# Patient Record
Sex: Female | Born: 1950 | Race: White | Hispanic: No | Smoking: Former smoker
Health system: Southern US, Community
[De-identification: ages and names within clinical notes are randomized; demographics above are authoritative.]

## PROBLEM LIST (undated history)

## (undated) DIAGNOSIS — M797 Fibromyalgia: Secondary | ICD-10-CM

## (undated) DIAGNOSIS — M549 Dorsalgia, unspecified: Secondary | ICD-10-CM

## (undated) DIAGNOSIS — K5792 Diverticulitis of intestine, part unspecified, without perforation or abscess without bleeding: Secondary | ICD-10-CM

## (undated) DIAGNOSIS — R112 Nausea with vomiting, unspecified: Secondary | ICD-10-CM

## (undated) DIAGNOSIS — K589 Irritable bowel syndrome without diarrhea: Secondary | ICD-10-CM

## (undated) DIAGNOSIS — Z973 Presence of spectacles and contact lenses: Secondary | ICD-10-CM

## (undated) DIAGNOSIS — E119 Type 2 diabetes mellitus without complications: Secondary | ICD-10-CM

## (undated) DIAGNOSIS — I10 Essential (primary) hypertension: Secondary | ICD-10-CM

## (undated) DIAGNOSIS — F410 Panic disorder [episodic paroxysmal anxiety] without agoraphobia: Secondary | ICD-10-CM

## (undated) DIAGNOSIS — I4891 Unspecified atrial fibrillation: Secondary | ICD-10-CM

## (undated) DIAGNOSIS — Z8719 Personal history of other diseases of the digestive system: Secondary | ICD-10-CM

## (undated) DIAGNOSIS — D649 Anemia, unspecified: Secondary | ICD-10-CM

## (undated) DIAGNOSIS — Z9889 Other specified postprocedural states: Secondary | ICD-10-CM

## (undated) DIAGNOSIS — F32A Depression, unspecified: Secondary | ICD-10-CM

## (undated) DIAGNOSIS — Z9289 Personal history of other medical treatment: Secondary | ICD-10-CM

## (undated) DIAGNOSIS — F329 Major depressive disorder, single episode, unspecified: Secondary | ICD-10-CM

## (undated) DIAGNOSIS — J189 Pneumonia, unspecified organism: Secondary | ICD-10-CM

## (undated) DIAGNOSIS — Z974 Presence of external hearing-aid: Secondary | ICD-10-CM

## (undated) DIAGNOSIS — M1990 Unspecified osteoarthritis, unspecified site: Secondary | ICD-10-CM

## (undated) DIAGNOSIS — F419 Anxiety disorder, unspecified: Secondary | ICD-10-CM

## (undated) DIAGNOSIS — E7800 Pure hypercholesterolemia, unspecified: Secondary | ICD-10-CM

## (undated) DIAGNOSIS — K219 Gastro-esophageal reflux disease without esophagitis: Secondary | ICD-10-CM

## (undated) DIAGNOSIS — G8929 Other chronic pain: Secondary | ICD-10-CM

## (undated) DIAGNOSIS — Z972 Presence of dental prosthetic device (complete) (partial): Secondary | ICD-10-CM

## (undated) HISTORY — PX: SMALL INTESTINE SURGERY: SHX150

## (undated) HISTORY — PX: JOINT REPLACEMENT: SHX530

## (undated) HISTORY — PX: CHOLECYSTECTOMY: SHX55

## (undated) HISTORY — PX: TUBAL LIGATION: SHX77

## (undated) HISTORY — PX: DILATION AND CURETTAGE OF UTERUS: SHX78

## (undated) HISTORY — PX: COLONOSCOPY: SHX174

## (undated) HISTORY — PX: BACK SURGERY: SHX140

## (undated) HISTORY — PX: HERNIA REPAIR: SHX51

---

## 2002-04-11 HISTORY — PX: CARPAL TUNNEL RELEASE: SHX101

## 2013-12-30 ENCOUNTER — Other Ambulatory Visit: Payer: Self-pay | Admitting: Neurosurgery

## 2013-12-30 ENCOUNTER — Ambulatory Visit
Admission: RE | Admit: 2013-12-30 | Discharge: 2013-12-30 | Disposition: A | Discharge status: Home / Self Care | Payer: Medicare Other | Source: Ambulatory Visit | Attending: Neurosurgery | Admitting: Neurosurgery

## 2013-12-30 DIAGNOSIS — M419 Scoliosis, unspecified: Secondary | ICD-10-CM

## 2014-01-01 ENCOUNTER — Other Ambulatory Visit: Payer: Self-pay | Admitting: Neurosurgery

## 2014-01-01 ENCOUNTER — Other Ambulatory Visit (HOSPITAL_COMMUNITY): Payer: Self-pay | Admitting: Neurosurgery

## 2014-01-01 DIAGNOSIS — M5416 Radiculopathy, lumbar region: Secondary | ICD-10-CM

## 2014-01-10 ENCOUNTER — Encounter (HOSPITAL_COMMUNITY): Payer: Self-pay | Admitting: Pharmacy Technician

## 2014-01-14 ENCOUNTER — Ambulatory Visit (HOSPITAL_COMMUNITY)
Admission: RE | Admit: 2014-01-14 | Discharge: 2014-01-14 | Disposition: A | Discharge status: Home / Self Care | Payer: Medicare Other | Source: Ambulatory Visit | Attending: Neurosurgery | Admitting: Neurosurgery

## 2014-01-14 DIAGNOSIS — Z79899 Other long term (current) drug therapy: Secondary | ICD-10-CM | POA: Insufficient documentation

## 2014-01-14 DIAGNOSIS — M5416 Radiculopathy, lumbar region: Secondary | ICD-10-CM | POA: Diagnosis not present

## 2014-01-14 LAB — GLUCOSE, CAPILLARY: GLUCOSE-CAPILLARY: 124 mg/dL — AB (ref 70–99)

## 2014-01-14 MED ORDER — OXYCODONE HCL 5 MG PO TABS
5.0000 mg | ORAL_TABLET | Freq: Once | ORAL | Status: AC
  Administered 2014-01-14: 5 mg via ORAL
  Filled 2014-01-14: qty 1

## 2014-01-14 MED ORDER — IOHEXOL 300 MG/ML  SOLN
10.0000 mL | Freq: Once | INTRAMUSCULAR | Status: AC | PRN
  Administered 2014-01-14: 10 mL via INTRAVENOUS

## 2014-01-14 MED ORDER — ONDANSETRON HCL 4 MG/2ML IJ SOLN: 4.0000 mg | Freq: Four times a day (QID) | INTRAMUSCULAR | Status: DC | PRN

## 2014-01-14 MED ORDER — DIAZEPAM 5 MG PO TABS
ORAL_TABLET | ORAL | Status: AC
  Administered 2014-01-14: 10 mg via ORAL
  Filled 2014-01-14: qty 2

## 2014-01-14 MED ORDER — OXYCODONE HCL 5 MG PO TABS
ORAL_TABLET | ORAL | Status: AC
  Filled 2014-01-14: qty 1

## 2014-01-14 MED ORDER — DIAZEPAM 5 MG PO TABS
10.0000 mg | ORAL_TABLET | Freq: Once | ORAL | Status: AC
  Administered 2014-01-14: 10 mg via ORAL
  Filled 2014-01-14: qty 2

## 2014-01-14 NOTE — Progress Notes (Signed)
DR STERN NOTIFIED OF 8/10 BACK PAIN AND STATES THIS IS THE LEVEL OF PAIN SHE HAS AT HOME; DR STERN NOTIFIED OF C/O PAIN AND ORDER NOTED AND PER DR STERN OK TO D/C HOME AFTER PAIN MED

## 2014-01-14 NOTE — Discharge Instructions (Signed)
Myelogram and Lumbar Puncture Discharge Instructions ° °1. Go home and rest quietly for the next 24 hours.  It is important to lie flat for the next 24 hours.  Get up only to go to the restroom.  You may lie in the bed or on a couch on your back, your stomach, your left side or your right side.  You may have one pillow under your head.  You may have pillows between your knees while you are on your side or under your knees while you are on your back. ° °2. DO NOT drive today.  Recline the seat as far back as it will go, while still wearing your seat belt, on the way home. ° °3. You may get up to go to the bathroom as needed.  You may sit up for 10 minutes to eat.  You may resume your normal diet and medications unless otherwise indicated. ° °4. The incidence of headache, nausea, or vomiting is about 5% (one in 20 patients).  If you develop a headache, lie flat and drink plenty of fluids until the headache goes away.  Caffeinated beverages may be helpful.  If you develop severe nausea and vomiting or a headache that does not go away with flat bed rest, call  [phone] ° °5. You may resume normal activities after your 24 hours of bed rest is over; however, do not exert yourself strongly or do any heavy lifting tomorrow. ° °6. Call your physician for a follow-up appointment.  The results of your myelogram will be sent directly to your physician by the following day. ° °7. If you have any questions or if complications develop after you arrive home, please call [phone] ° °Discharge instructions have been explained to the patient.  The patient, or the person responsible for the patient, fully understands these instructions. ° ° °

## 2014-01-14 NOTE — Procedures (Signed)
Lumbar puncture L 12, Omnipaque 300 6 cc

## 2014-01-27 ENCOUNTER — Other Ambulatory Visit: Payer: Self-pay | Admitting: Neurosurgery

## 2014-01-29 ENCOUNTER — Telehealth: Payer: Self-pay | Admitting: Vascular Surgery

## 2014-01-29 NOTE — Telephone Encounter (Addendum)
Message copied by Rosalyn ChartersOUX, BONNIE A on Wed Jan 29, 2014 11:22 AM ------      Message from: Lamar BlinksPULLINS, CAROL S      Created: Wed Jan 29, 2014  9:58 AM      Regarding: needs consult with Dr. Arbie CookeyEarly       Please schedule this pt. for an office consult prior to a Redo ALIF scheduled on 02/28/14.  Please remind pt. to bring the CD ROM of LS spine films to appt. with her.  ------  notified patient of appointment with dr. early on 02-25-14 at 10am, mailed np information

## 2014-02-11 ENCOUNTER — Other Ambulatory Visit: Payer: Self-pay

## 2014-02-19 ENCOUNTER — Encounter (HOSPITAL_COMMUNITY)
Admission: RE | Admit: 2014-02-19 | Discharge: 2014-02-19 | Disposition: A | Discharge status: Home / Self Care | Payer: Medicare Other | Source: Ambulatory Visit | Attending: Neurosurgery | Admitting: Neurosurgery

## 2014-02-19 ENCOUNTER — Encounter (HOSPITAL_COMMUNITY): Payer: Self-pay

## 2014-02-19 DIAGNOSIS — Z01810 Encounter for preprocedural cardiovascular examination: Secondary | ICD-10-CM | POA: Insufficient documentation

## 2014-02-19 DIAGNOSIS — Z01812 Encounter for preprocedural laboratory examination: Secondary | ICD-10-CM | POA: Insufficient documentation

## 2014-02-19 DIAGNOSIS — M438X6 Other specified deforming dorsopathies, lumbar region: Secondary | ICD-10-CM | POA: Insufficient documentation

## 2014-02-19 HISTORY — DX: Type 2 diabetes mellitus without complications: E11.9

## 2014-02-19 HISTORY — DX: Pneumonia, unspecified organism: J18.9

## 2014-02-19 HISTORY — DX: Anxiety disorder, unspecified: F41.9

## 2014-02-19 HISTORY — DX: Pure hypercholesterolemia, unspecified: E78.00

## 2014-02-19 HISTORY — DX: Dorsalgia, unspecified: M54.9

## 2014-02-19 HISTORY — DX: Anemia, unspecified: D64.9

## 2014-02-19 HISTORY — DX: Other specified postprocedural states: Z98.890

## 2014-02-19 HISTORY — DX: Personal history of other diseases of the digestive system: Z87.19

## 2014-02-19 HISTORY — DX: Depression, unspecified: F32.A

## 2014-02-19 HISTORY — DX: Irritable bowel syndrome, unspecified: K58.9

## 2014-02-19 HISTORY — DX: Presence of spectacles and contact lenses: Z97.3

## 2014-02-19 HISTORY — DX: Unspecified osteoarthritis, unspecified site: M19.90

## 2014-02-19 HISTORY — DX: Nausea with vomiting, unspecified: R11.2

## 2014-02-19 HISTORY — DX: Major depressive disorder, single episode, unspecified: F32.9

## 2014-02-19 HISTORY — DX: Essential (primary) hypertension: I10

## 2014-02-19 HISTORY — DX: Presence of external hearing-aid: Z97.4

## 2014-02-19 HISTORY — DX: Diverticulitis of intestine, part unspecified, without perforation or abscess without bleeding: K57.92

## 2014-02-19 HISTORY — DX: Other chronic pain: G89.29

## 2014-02-19 HISTORY — DX: Gastro-esophageal reflux disease without esophagitis: K21.9

## 2014-02-19 HISTORY — DX: Personal history of other medical treatment: Z92.89

## 2014-02-19 HISTORY — DX: Panic disorder (episodic paroxysmal anxiety): F41.0

## 2014-02-19 HISTORY — DX: Unspecified atrial fibrillation: I48.91

## 2014-02-19 HISTORY — DX: Presence of dental prosthetic device (complete) (partial): Z97.2

## 2014-02-19 HISTORY — DX: Fibromyalgia: M79.7

## 2014-02-19 LAB — ABO/RH: ABO/RH(D): O POS

## 2014-02-19 LAB — CBC
HEMATOCRIT: 36.9 % (ref 36.0–46.0)
Hemoglobin: 11.8 g/dL — ABNORMAL LOW (ref 12.0–15.0)
MCH: 31 pg (ref 26.0–34.0)
MCHC: 32 g/dL (ref 30.0–36.0)
MCV: 96.9 fL (ref 78.0–100.0)
Platelets: 205 10*3/uL (ref 150–400)
RBC: 3.81 MIL/uL — ABNORMAL LOW (ref 3.87–5.11)
RDW: 14 % (ref 11.5–15.5)
WBC: 8.4 10*3/uL (ref 4.0–10.5)

## 2014-02-19 LAB — BASIC METABOLIC PANEL
Anion gap: 13 (ref 5–15)
BUN: 13 mg/dL (ref 6–23)
CO2: 25 meq/L (ref 19–32)
CREATININE: 0.66 mg/dL (ref 0.50–1.10)
Calcium: 9.3 mg/dL (ref 8.4–10.5)
Chloride: 101 mEq/L (ref 96–112)
GFR calc Af Amer: >90 mL/min (ref >90)
GFR calc non Af Amer: >90 mL/min (ref >90)
Glucose, Bld: 96 mg/dL (ref 70–99)
Potassium: 4 mEq/L (ref 3.7–5.3)
Sodium: 139 mEq/L (ref 137–147)

## 2014-02-19 LAB — SURGICAL PCR SCREEN
MRSA, PCR: POSITIVE — AB
Staphylococcus aureus: POSITIVE — AB

## 2014-02-19 LAB — TYPE AND SCREEN
ABO/RH(D): O POS
ANTIBODY SCREEN: NEGATIVE

## 2014-02-19 NOTE — Progress Notes (Signed)
   02/19/14 1528  OBSTRUCTIVE SLEEP APNEA  Have you ever been diagnosed with sleep apnea through a sleep study? No  Do you snore loudly (loud enough to be heard through closed doors)?  1  Do you often feel tired, fatigued, or sleepy during the daytime? 1  Has anyone observed you stop breathing during your sleep? 0  Do you have, or are you being treated for high blood pressure? 1  BMI more than 35 kg/m2? 0  Age over 64 years old? 1  Neck circumference greater than 40 cm/16 inches? 0  Gender: 0  Obstructive Sleep Apnea Score 4

## 2014-02-19 NOTE — Progress Notes (Signed)
Pt denies SOB, chest pain, and being under the care of a cardiologist. Pt denies having a stress test, echo, and cardiac cath. Pt denies having an EKG within the last year. PCP Dr[facility] Koleen DistanceSteven James [facility]ivey [facility] [place] made aware of + STOP- BANG Assessment Tool results.

## 2014-02-19 NOTE — Pre-Procedure Instructions (Signed)
Kristen ClarkRita S Snow  02/19/2014   Your procedure is scheduled on: Friday, February 28, 2014  [facility] at 5;30 AM.  Call this number if you have problems the morning of surgery: [phone]   Remember:   Do not eat food or drink liquids after midnight Thursday, February 27, 2014   Take these medicines the morning of surgery with A SIP OF WATER:amLODipine (NORVASC),  ARIPiprazole (ABILIFY), esomeprazole (NEXIUM), memantine (NAMENDA), metoprolol tartrate (LOPRESSOR), quiNINE (QUALAQUIN), tiotropium (SPIRIVA), budesonide-formoterol (SYMBICORT),  fluticasone (FLONASE)  nasal spray, if needed: oxyCODONE-acetaminophen (PERCOCET) for pain, LORazepam (ATIVAN) for anxiety, GENTEAL eye drops for redness/ swelling  Stop taking Aspirin, vitamins, and herbal medications. Do not take any NSAIDs ie: Ibuprofen, Advil, Naproxen or any medication containing Aspirin; stop 5 days prior to procedure ( Sunday, February 23, 2014)  Do not wear jewelry, make-up or nail polish.  Do not wear lotions, powders, or perfumes. You may not wear deodorant.  Do not shave 48 hours prior to surgery.   Do not bring valuables to the hospital.  [facility] is not responsible for any belongings or valuables.               Contacts, dentures or bridgework may not be worn into surgery.  Leave suitcase in the car. After surgery it may be brought to your room.  For patients admitted to the hospital, discharge time is determined by your treatment team.               Patients discharged the day of surgery will not be allowed to drive home.  Name and phone number of your driver:   Special Instructions:  Special Instructions:Special Instructions: [facility] - Pre[facility]  Before surgery, you can play an important role.  Because skin is not sterile, your skin needs to be as free of germs as possible.  You can reduce the number of germs on you skin by washing with CHG (chlorahexidine gluconate) soap  before surgery.  CHG is an antiseptic cleaner which kills germs and bonds with the skin to continue killing germs even after washing.  Please DO NOT use if you have an allergy to CHG or antibacterial soaps.  If your skin becomes reddened/irritated stop using the CHG and inform your nurse when you arrive at Short Stay.  Do not shave (including legs and underarms) for at least 48 hours prior to the first CHG shower.  You may shave your face.  Please follow these instructions carefully:   1.  Shower with CHG Soap the night before surgery and the morning of Surgery.  2.  If you choose to wash your hair, wash your hair first as usual with your normal shampoo.  3.  After you shampoo, rinse your hair and body thoroughly to remove the Shampoo.  4.  Use CHG as you would any other liquid soap.  You can apply chg directly  to the skin and wash gently with scrungie or a clean washcloth.  5.  Apply the CHG Soap to your body ONLY FROM THE NECK DOWN.  Do not use on open wounds or open sores.  Avoid contact with your eyes, ears, mouth and genitals (private parts).  Wash genitals (private parts) with your normal soap.  6.  Wash thoroughly, paying special attention to the area where your surgery will be performed.  7.  Thoroughly rinse your body with warm water from the neck down.  8.  DO NOT shower/wash with  your normal soap after using and rinsing off the CHG Soap.  9.  Pat yourself dry with a clean towel.            10.  Wear clean pajamas.            11.  Place clean sheets on your bed the night of your first shower and do not sleep with pets.  Day of Surgery  Do not apply any lotions/deodorants the morning of surgery.  Please wear clean clothes to the hospital/surgery center.   Please read over the following fact sheets that you were given: Pain Booklet, Coughing and Deep Breathing, Blood Transfusion Information, MRSA Information and Surgical Site Infection Prevention

## 2014-02-20 NOTE — Progress Notes (Signed)
Anesthesia Chart Review:   Pt is 63 year old female scheduled for redo L5-S1 ALIF with revision of hardware on 02/28/2014 with Drs. Venetia MaxonStern and Early.  PMH: HTN, atrial fibrillation, DM, hypercholesterolemia, anxiety, GERD. Former smoker. BMI 33  Medications include: metoprolol, amlodipine, symbicort, spiriva, abilify, boniva, namenda, quinine  Preoperative labs reviewed.    EKG: NSR. ST & T wave abnormality, consider lateral ischemia.   Discussed case with Dr. Aleene DavidsonE. Fitzgerald.   Rica Mastngela Litsy Epting, FNP-BC [facility]/Anesthesiology Phone: [phone] 02/20/2014 4:48 PM

## 2014-02-24 ENCOUNTER — Encounter: Payer: Self-pay | Admitting: Vascular Surgery

## 2014-02-25 ENCOUNTER — Encounter: Payer: Self-pay | Admitting: Vascular Surgery

## 2014-02-25 ENCOUNTER — Ambulatory Visit (INDEPENDENT_AMBULATORY_CARE_PROVIDER_SITE_OTHER): Payer: PRIVATE HEALTH INSURANCE | Admitting: Vascular Surgery

## 2014-02-25 VITALS — BP 162/74 | HR 109 | Ht 61.0 in | Wt 173.5 lb

## 2014-02-25 DIAGNOSIS — M5137 Other intervertebral disc degeneration, lumbosacral region: Secondary | ICD-10-CM

## 2014-02-25 NOTE — Progress Notes (Signed)
Patient name: Kristen ClarkRita S Snow MRN: [id number] DOB: 10/04/1950 Sex: female   Referred by: Kristen Snow  Reason for referral:  Chief Complaint  Patient presents with  . New Evaluation    consult prior to redo ALIF    HISTORY OF PRESENT ILLNESS: Patient presents today to discuss anterior approach for L5-S1 disc surgery. She is here today with her family. She has an extremely complex past history regarding her back. She did have disc surgery greater than 10 years ago with good result and subsequentlyhad a more extensive operation for scoliosis. She will parts that in March of this year she began having severe back and leg discomfort. Evaluation revealed progressive disc disease and was seen by Dr. Venetia Snow who has recommended L5-S1 disc surgery with anterior approach. She is seen today for discussion of this. I have her a CT myelogram for review. She has multiple medical problems as listed below but denies any cardiac difficulties. She is extremely uncomfortable. She is lying on her side on the exam table with a great deal of difficulty in any mobilization and walks with a rolling walker.  Past Medical History  Diagnosis Date  . PONV (postoperative nausea and vomiting)   . Diabetes mellitus without complication   . Hypertension   . Hypercholesterolemia   . Chronic back pain   . Anxiety   . Panic attacks   . Depression   . Wears glasses   . Wears dentures   . Wears hearing aid   . IBS (irritable bowel syndrome)   . Diverticulitis   . A-fib   . Pneumonia   . GERD (gastroesophageal reflux disease)   . History of hiatal hernia   . Fibromyalgia   . Arthritis   . Anemia   . History of blood transfusion     Past Surgical History  Procedure Laterality Date  . Back surgery    . Cholecystectomy    . Small intestine surgery    . Joint replacement      Left hip  . Hernia repair    . Dilation and curettage of uterus    . Tubal ligation    . Colonoscopy    . Carpal tunnel release  2004     History   Social History  . Marital Status: Widowed    Spouse Name: N/A    Number of Children: N/A  . Years of Education: N/A   Occupational History  . Not on file.   Social History Main Topics  . Smoking status: Former Smoker    Types: Cigarettes    Quit date: 08/09/2012  . Smokeless tobacco: Never Used     Comment: Quit smoking cigarettes May 2014  . Alcohol Use: No  . Drug Use: No  . Sexual Activity: Not on file   Other Topics Concern  . Not on file   Social History Narrative    Family History  Problem Relation Age of Onset  . Diabetes Mother   . Arthritis Sister   . Diabetes Sister     Allergies as of 02/25/2014  . (No Known Allergies)    Current Outpatient Prescriptions on File Prior to Visit  Medication Sig Dispense Refill  . amitriptyline (ELAVIL) 75 MG tablet Take 75 mg by mouth at bedtime.    Marland Kitchen. amLODipine (NORVASC) 2.5 MG tablet Take 2.5 mg by mouth daily.    . ARIPiprazole (ABILIFY) 10 MG tablet Take 10 mg by mouth daily.    . Biotin (BIOTIN 5000)  5 MG CAPS Take 5 mg by mouth daily.    . budesonide-formoterol (SYMBICORT) 160-4.5 MCG/ACT inhaler Inhale 2 puffs into the lungs 2 (two) times daily.     . cyanocobalamin (,VITAMIN B-12,) 1000 MCG/ML injection Inject 1,000 mcg into the muscle every 14 (fourteen) days. Vitamin B12 - on the 1st and the 15th of each month    . esomeprazole (NEXIUM) 40 MG capsule Take 40 mg by mouth 2 (two) times daily before a meal.    . fenofibrate (TRICOR) 145 MG tablet Take 145 mg by mouth daily with supper.    . fluticasone (FLONASE) 50 MCG/ACT nasal spray Place 2 sprays into both nostrils daily.    Marland Kitchen. ibandronate (BONIVA) 150 MG tablet Take 150 mg by mouth every 30 (thirty) days. Take in the morning with a full glass of water, on an empty stomach, and do not take anything else by mouth or lie down for the next 30 min. (on the 1st of each month)    . levocetirizine (XYZAL) 5 MG tablet Take 5 mg by mouth daily with supper.     . loperamide (IMODIUM A-D) 2 MG tablet Take 2 mg by mouth daily.     Marland Kitchen. LORazepam (ATIVAN) 1 MG tablet Take 1 mg by mouth 3 (three) times daily as needed for anxiety.     . memantine (NAMENDA) 10 MG tablet Take 10 mg by mouth 2 (two) times daily.    . Menthol, Topical Analgesic, (BENGAY EX) Apply 1 application topically daily as needed (pain).    . metaxalone (SKELAXIN) 800 MG tablet Take 800 mg by mouth 3 (three) times daily.    . metoprolol tartrate (LOPRESSOR) 25 MG tablet Take 25 mg by mouth 2 (two) times daily.     Marland Kitchen. oxyCODONE-acetaminophen (PERCOCET) 7.5-325 MG per tablet Take 1 tablet by mouth every 6 (six) hours as needed for pain.    . polyethylene glycol (MIRALAX / GLYCOLAX) packet Take 17 g by mouth daily as needed (for constipation).    . promethazine (PHENERGAN) 25 MG tablet Take 25 mg by mouth 3 (three) times daily as needed for nausea or vomiting.    . quiNINE (QUALAQUIN) 324 MG capsule Take 648 mg by mouth 3 (three) times daily.    Marland Kitchen. tiotropium (SPIRIVA) 18 MCG inhalation capsule Place 18 mcg into inhaler and inhale daily.     . Vitamin D, Ergocalciferol, (DRISDOL) 50000 UNITS CAPS capsule Take 50,000 Units by mouth 2 (two) times a week. On Wednesday and Saturday    . Carboxymethylcell-Hypromellose (GENTEAL) 0.25-0.3 % GEL Place 1 drop into both eyes 2 (two) times daily as needed (redness/ swelling).     No current facility-administered medications on file prior to visit.     REVIEW OF SYSTEMS:  Positives indicated with an "X"  CARDIOVASCULAR:  [ ]  chest pain   [ ]  chest pressure   [ ]  palpitations   [ ]  orthopnea   [ ]  dyspnea on exertion   [ ]  claudication   [ ]  rest pain   [ ]  DVT   [ ]  phlebitis PULMONARY:   [ ]  productive cough   [ ]  asthma   [ ]  wheezing NEUROLOGIC:   [ ]  weakness  [ x] paresthesias  [ ]  aphasia  [ ]  amaurosis  [ ]  dizziness HEMATOLOGIC:   [ ]  bleeding problems   [ ]  clotting disorders MUSCULOSKELETAL:  [ ]  joint pain   [ ]  joint  swelling GASTROINTESTINAL: [ ]   blood in stool  [ ]   hematemesis GENITOURINARY:  [ ]   dysuria  [ ]   hematuria PSYCHIATRIC:  [ ]  history of major depression INTEGUMENTARY:  [ ]  rashes  [ ]  ulcers CONSTITUTIONAL:  [ ]  fever   [ ]  chills  PHYSICAL EXAMINATION:  General: The patient is a well-nourished female, ilying on the exam table with a great degree of back discomfort. Vital signs are BP 162/74 mmHg  Pulse 109  Ht 5\' 1"  (1.549 m)  Wt 173 lb 8 oz (78.699 kg)  BMI 32.80 kg/m2  SpO2 98% Pulmonary: There is a good air exchange bilaterally without wheezing or rales. Abdomen: Soft and non-tender with normal pitch bowel sounds.she does have a long left paramedian incision. She did have a prior anterior approach for spine surgery does not recall if this is the incision or not. Musculoskeletal: There are no major deformities.  There is no significant extremity pain. Neurologic: No focal weakness or paresthesias are detected, Skin: There are no ulcer or rashes noted. Psychiatric: The patient has normal affect. Cardiovascular: There is a regular rate and rhythm without significant murmur appreciated. I do not palpate popliteal pulses. Difficult for her to position to feel femoral pulses  CT of her back reveals extensive calcification in her aorta which is circumferential above the bifurcation. She has minimal calcification in her iliac vessels.  Impression and Plan:  Severe degenerative disc disease. I did explain my role in anterior exposure. She had several concerning issues. She has had prior anterior exposure and this could cause a greater difficulty in exposure. Explain that this simply prolonged procedure but do not feel that this is prohibitive. Also she has extensive calcification of her aorta. Since this is a L5-S1 disc feel that this is safe to proceed from an anterior approach since she has minimal atherosclerotic change at her bifurcation. I did explain mobilization of intraperitoneal  contents, left ureter and the arterial and venous structures of the potential injury of all these. She understands and wished to proceed as scheduled on 02/28/2014    Javin Nong [facility] Office: [phone]

## 2014-02-27 MED ORDER — CEFAZOLIN SODIUM-DEXTROSE 2-3 GM-% IV SOLR
2.0000 g | INTRAVENOUS | Status: DC
  Filled 2014-02-27: qty 50

## 2014-02-27 NOTE — H&P (Signed)
> [address] [place], [place] [address] Phone: [phone]   Patient ID:   [id number] Patient: Kristen FarrierRita Rihn  Date of Birth: 12-Mar-1951 Visit Type: Office Visit   Date: 01/22/2014 12:45 PM Provider: Danae OrleansJoseph D. Venetia MaxonStern MD   This 63 year old female presents for Follow Up of back pain.  History of Present Illness: 1.  Follow Up of back pain  The lumbar imaging studies revealed a complex problem.  The patient is a vertical fracture through L5 and appears to have significant by foraminal stenosis at L5-S1.  She prior anterior lumbar fusion L2-3 L3 L4 and L4 L5 levels but has never had anterior exposure the L5-S1 level.  I reviewed the patient's imaging studies with Dr. Bryson HaKwan and per our discussion I have recommended to the patient that she undergo redo anterior lumbar interbody fusion at L5-S1 with possible vertebrectomy at this level with pedicle screw fixation L5 and S1 with bilateral iliac fixation.  All Dr. Karlene LinemanEarley and discussed the patient's case with him and he reviewed her imaging studies and he felt that he could provide access from an anterior approach.  The patient will need a bone growth stimulator.  I spoke with the patient and her son at great length and explained that this is an extremely difficult procedure and therefore significant inherent risks.  They understand that and work me performing the procedure and she says that she is unable to tolerate her current level of pain and needs to do something.      Medical/Surgical/Interim History Reviewed, no change.  Last detailed document date:12/30/2013.   PAST MEDICAL HISTORY, SURGICAL HISTORY, FAMILY HISTORY, SOCIAL HISTORY AND REVIEW OF SYSTEMS I have reviewed the patient's past medical, surgical, family and social history as well as the comprehensive review of systems as included on the [facility] history form dated 12/30/2013, which I have signed.  Family History: Reviewed, no  changes.  Last detailed document: 12/30/2013.   Social History: Tobacco use reviewed. Reviewed, no changes. Last detailed document date: 12/30/2013.      MEDICATIONS(added, continued or stopped this visit):   ALLERGIES:  Ingredient Reaction Medication Name Comment  NO KNOWN ALLERGIES     No known allergies.    Vitals Date Temp F BP Pulse Ht In Wt Lb BMI BSA Pain Score  01/22/2014  145/75 101 61 168.2 31.78  9/10      DIAGNOSTIC RESULTS Diagnostic report text  CLINICAL DATA: 63 year old female with multiple prior back surgeries now with increased pain radiating to the left lower extremity nearly to the ankle. Left lower extremity cramping. Difficulty sitting and standing for long periods. Fall in March. Initial encounter.  FLUOROSCOPY TIME: 1 min 30 seconds  PROCEDURE: LUMBAR AND THORACIC MYELOGRAM  CT LUMBAR MYELOGRAM  CT THORACIC MYELOGRAM  Lumbar puncture and intrathecal contrast administration were performed by Dr. Maeola HarmanJOSEPH Tarvaris Puglia who will separately report for the portion of the procedure. I personally supervised acquisition of the myelogram images.  I personally performed the lumbar puncture and administered the intrathecal contrast. I also personally supervised acquisition of the myelogram images.  TECHNIQUE: Contiguous axial images were obtained through the Cervical, Thoracic, and Lumbar spine after the intrathecal infusion of infusion. Coronal and sagittal reconstructions were obtained of the axial image sets.  FINDINGS: THORACIC AND LUMBAR MYELOGRAM FINDINGS:  Stable appearance of posterior spinal hardware from the T10 to the L5 level. No pedicle screws at L1 or L3 as before. L1 compression fracture appears stable. Extensive  aortoiliac calcified atherosclerosis. Interbody implants at L2 L3 through L4 L followup.  Irregularity in the appearance of the thecal sac at T12-L1, with a folded appearance (series 2). This may affect the lateral  recesses, attention on CT described below.  No obstruction to the cephalad flow of contrast into the thoracic spine using gravity. The thecal sac at the operated lower thoracic levels appears normally patent. Thecal sac delineation was difficult above that level by plain myelography.  Cross-table lateral view of the lumbar spine was obtained during needle placement.  CT THORACIC MYELOGRAM FINDINGS:  Visualized lung parenchyma remarkable for multifocal areas of ground-glass opacity favored to reflect a combination of atelectasis and scarring. There may be underlying paraseptal emphysema. No pericardial or pleural effusion identified. Coronary artery calcified plaque and/or a vascular stent is evident. Negative thoracic inlet. Negative visualized non contrast upper abdominal viscera. Calcified atherosclerosis of the aorta. Negative posterior paraspinal soft tissues.  Lower thoracic spine hardware described below. Good intrathecal contrast. Normal myelographic appearance of the thoracic spinal cord. Conus medullaris mostly visible at the T12 level.  C7-T1: Partially visible, negative.  T1-T2: Negative except for mild uncovertebral hypertrophy.  T2-T3: Partially calcified central disc protrusion, disc osteophyte complex (sagittal image 39) narrowing the ventral CSF space. No significant spinal stenosis. No foraminal involvement.  T3-T4: Negative.  T4-T5: Negative disc. Moderate facet hypertrophy greater on the right. Moderate right T4 foraminal stenosis (sagittal image 34).  T5-T6: Central calcified disc protrusion or disc osteophyte complex narrowing the ventral CSF space. No significant spinal stenosis. Moderate to severe facet hypertrophy on the right, severe right T5 foraminal stenosis (sagittal image 34).  T6-T7: Circumferential disc bulge. Moderate facet hypertrophy. Moderate bilateral T6 foraminal stenosis.  T7-T8: Disc space loss. Trace vacuum disc. Lobulated  posterior disc protrusion (sagittal image 41), effacing the ventral CSF space. The dorsal CSF space is patent. Disc osteophyte complex eccentric to the left in does appear to involve the left T7 neural foramen with mild to moderate stenosis.  T8-T9: Mild partially calcified disc bulge. No significant stenosis.  T9-T10: Mild partially calcified disc bulge. Mild facet hypertrophy. Mild right T9 foraminal stenosis.  T10-T11: Transpedicular hardware in place with no evidence of loosening. Broad-based left paracentral disc protrusion best seen on sagittal image 42 and series 202, image 108. This narrows the ventral CSF space without significant spinal stenosis. Posterior element hypertrophy. Associated uncovertebral hypertrophy on the left with moderate left T10 foraminal stenosis suspected (sagittal image 46). There is suggestion of right posterior element ankylosis.  T11-T12: Small partially calcified central disc protrusion. No stenosis. No definite arthrodesis.  CT LUMBAR MYELOGRAM FINDINGS:  Aortoiliac calcified atherosclerosis noted. Diverticulosis of the distal colon. Partially visible distended bladder. Staple line at the duodenum bulb partially visible. Postoperative changes to the lumbar posterior soft tissues.  Additional postoperative details are below, but there are chronic appearing fractures of the L1 and L5 vertebral bodies. Widespread lumbar decompression. Osteopenia in the lumbar spine. Visible sacral ala and SI joints intact. Postoperative changes to the medial left iliac bone.  Good intrathecal contrast opacification. Normal myelographic appearance of the lower thoracic spinal cord, with tip of the conus at T12-L1. However, there is clumping of nerve roots intermittently noted (right thecal sac at L1-L2, central and posterior thecal sac from L2-L3 to L4-L5.  T12-L1: Circumferential disc osteophyte complex. Moderate posterior element hypertrophy. No significant  spinal stenosis. Moderate left T12 and mild right T12 foraminal stenosis primarily due to bony hypertrophy.  L1-L2: Chronic L1 compression fracture.  Near complete loss of the L1-L2 disc space. A bulky circumferential disc osteophyte complex but sequelae of decompression questionable posterior element arthrodesis on the left. No spinal stenosis. Moderate to severe bilateral L1 foraminal stenosis.  L2-L3: interbody implant. Transpedicular hardware only at L2, without evidence of loosening. Evidence of transpedicular screw removal at L3. Evidence of solid interbody arthrodesis (coronal image 31). Mild osseous left L2 foraminal stenosis.  L3-L4: Interbody implant. Transpedicular hardware at L4 without evidence of loosening. Sequelae of decompression. Solid-appearing interbody arthrodesis (sagittal image 41). No spinal stenosis. Mild bilateral osseous L3 foraminal stenosis.  L4-L5: Transpedicular hardware without loosening. Interbody implant. Evidence of solid arthrodesis (sagittal image 39). Sequelae of decompression. No significant stenosis.  L5-S1: Chronic L5 compression fracture. L5 transpedicular screw tips are along an anterior plane of the chronic fracture. Circumferential disc osteophyte complex. Moderate to severe facet hypertrophy. Subsequent severe spinal stenosis (series 204, image 107). Moderate to severe bilateral L5 foraminal stenosis.  Sacral epidural lipomatosis. No S1 foraminal stenosis.  IMPRESSION: 1. Lower thoracic postoperative changes from T10-T12. Suspect right posterior element ankylosis at T10-T11. No spinal stenosis at these levels. Moderate multifactorial left T10 foraminal stenosis. 2. Thoracic disc protrusions, most pronounced at T7-T8. No significant spinal stenosis results. There is additional multifactorial moderate thoracic neural foraminal stenosis at the right C4, right T5 (severe), bilateral T6, and left T7 nerve levels. 3. Chronic L1 and L5  compression fractures. Sequelae of posterior and interbody fusion from L2 to the L5 level. Sequelae of decompression from the L1 to the L4 level. Solid interbody arthrodesis L2-L3 through L4-L5. 4. 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 Multifactorial moderate to severe biforaminal stenosis at L1-L2. 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   Electronically Signed By: Augusto Gamble M.D. On: 01/14/2014 14:55    IMPRESSION L5 fracture with bilateral foraminal stenosis L5-S1  Completed Orders (this encounter) Order Details Reason Side Interpretation Result Initial Treatment Date Region  Hypertension education Follow up with primary care physician for elevated blood pressure.        Dietary management education, guidance, and counseling Encouraged to eat a well balanced diet and follow up with primary care physician.         Assessment/Plan # Detail Type Description   1. 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       2. Assessment Low back pain, unspecified back pain laterality, with sciatica presence unspecified (M54.5).       3. Assessment Radiculopathy, lumbosacral region (M54.17).       4. Assessment Lumbar compression fracture, closed, initial encounter (S32.000A).       5. Assessment Essential (primary) hypertension (I10).       6. Assessment Body mass index (BMI) 31.0-31.9, adult (Z68.31).   Plan Orders Today's instructions / counseling include(s) Dietary management education, guidance, and counseling.         Pain Assessment/Treatment Pain Scale: 9/10. Method: Numeric Pain Intensity Scale. Location: back, leg. Onset: 06/29/2012. Duration: varies. Quality: discomforting. Pain Assessment/Treatment follow-up plan of care: Patient is currently taking medication for pain as prescribed..  Fall Risk Plan The patient has not fallen in the last year.  As above complex lumbar reconstructive  surgery  Orders: Office Procedures/Services: Assessment Service Comments   Preop appt with Dr. Arbie Cookey    Diagnostic Procedures: Assessment Procedure  M54.5 Bone growth stimulator  S32.000A ALIF L 5 S 1 with pedicle screw fixation and iliac screws Dr. Arbie Cookey to assist with approach  Instruction(s)/Education: Assessment Instruction  I10 Hypertension  education  [phone] Dietary management education, guidance, and counseling             Provider:  Danae OrleansJoseph D. Venetia MaxonStern MD  01/26/2014 07:14 PM Dictation edited by: Danae OrleansJoseph D. [facility]    CC Providers: [facility] [place] [facility] [address] [place], [place] [address] ----------------------------------------------------------------------------------------------------------------------------------------------------------------------         Electronically signed by Danae OrleansJoseph D. Venetia MaxonStern MD on 01/26/2014 07:14 PM   [address] [place], [place] [address] Phone: [phone]   Patient ID:   [id number] Patient: Kristen FarrierRita Kroft  Date of Birth: 1950/09/30 Visit Type: Office Visit   Date: 12/30/2013 10:30 AM Provider: Danae OrleansJoseph D. Venetia MaxonStern MD   This 63 year old female presents for back pain and Leg pain.  History of Present Illness: 1.  back pain  2.  Leg pain  Kristen Farrierita Schack, 62y.o. female, visits reporting increasing lumbar & BLE pain & numbness since March.  She reports lumbar [facility], and [facility] by DrSyad.  She recovered nicely from both, but began having pain in late March 2015.  She had three episodes of nocturnal urinary incontinence and sought follow up.  Dr. Janeece RiggersSu ordered MRI & offered surgery, but pt wanted to see DrStern.  Oxy 5mg  TID Skelaxin 800mg  TID Gabapentin 900mg  TID  Hx: HTN, NIDDM (diet) SxHx: back '03, '14   MRI 4/15 not available. X-rays on [place]  Patient is currently complaining of pain medicine to both her legs and says it began in March 2015.  She had back [facility] at [facility] in  2003 and subsequent [facility] by Dr. Kathi LudwigSyed.  She saw Dr. Raynald KempHsu in consultation and neither she nor her son were pleased with that interaction.  The patient had urinary incontinence and was evaluated for this.  She says that this is subsequently improved.  She says that the pain is increased significantly and marked through June at increased pain.  She says she now cannot get out of bed without a lot of discomfort.  She says that she had surgery from the front and her belly at [facility] by Dr. Melvia HeapsMark Shaffer E.  This is in addition to posterior spinal surgery.  The patient describes that she cannot sit on her left buttock.  She says that she did well after the most recent surgery but then has begun to have significantly increased pain.        PAST MEDICAL/SURGICAL HISTORY   (Detailed)  Disease/disorder Onset Date Management Date Comments  Arthritis      Hypertension          Family History  (Detailed)  Relationship Family Member Name Deceased Age at Death Condition Onset Age Cause of Death      Family history of Stroke  N      Family history of Diabetes mellitus  N      Family history of Hypertension  N   SOCIAL HISTORY  (Detailed) Tobacco use reviewed. Preferred language is Unknown.   Smoking status: Never smoker.  SMOKING STATUS Use Status Type Smoking Status Usage Per Day Years Used Total Pack Years  no/never  Never smoker             MEDICATIONS(added, continued or stopped this visit):   ALLERGIES:  Ingredient Reaction Medication Name Comment  NO KNOWN ALLERGIES     No known allergies.  REVIEW OF SYSTEMS System Neg/Pos Details  Constitutional Negative Chills, fatigue, fever, malaise, night sweats, weight  gain and weight loss.  ENMT Negative Ear drainage, hearing loss, nasal drainage, otalgia, sinus pressure and sore throat.  Eyes Negative Eye discharge, eye pain and vision changes.  Respiratory Negative Chronic cough, cough, dyspnea, known TB  exposure and wheezing.  Cardio Negative Chest pain, claudication, edema and irregular heartbeat/palpitations.  GI Negative Abdominal pain, blood in stool, change in stool pattern, constipation, decreased appetite, diarrhea, heartburn, nausea and vomiting.  GU Negative Dysuria, hematuria, hot flashes, irregular menses, polyuria, urinary frequency, urinary incontinence and urinary retention.  Endocrine Negative Cold intolerance, heat intolerance, polydipsia and polyphagia.  Neuro Positive Numbness in extremity.  Psych Negative Anxiety, depression and insomnia.  Integumentary Negative Brittle hair, brittle nails, change in shape/size of mole(s), hair loss, hirsutism, hives, pruritus, rash and skin lesion.  MS Positive Back pain, BLE pain.  Hema/Lymph Negative Easy bleeding, easy bruising and lymphadenopathy.  Allergic/Immuno Negative Contact allergy, environmental allergies, food allergies and seasonal allergies.  Reproductive Negative Breast discharge, breast lump(s), dysmenorrhea, dyspareunia, history of abnormal PAP smear and vaginal discharge.    Vitals Date Temp F BP Pulse Ht In Wt Lb BMI BSA Pain Score  12/30/2013  148/87 101 61 169 31.93  9/10     PHYSICAL EXAM General Level of Distress: no acute distress Overall Appearance: normal  Head and Face  Right Left  Fundoscopic Exam:  normal normal    Cardiovascular Cardiac: regular rate and rhythm without murmur  Respiratory Lungs: clear to auscultation  Neurological Recent and Remote Memory: normal Attention Span and Concentration:   normal Language: normal Fund of Knowledge: normal  Right Left Sensation: normal normal Upper Extremity Coordination: normal normal  Lower Extremity Coordination: normal normal  Musculoskeletal Gait and Station: normal  Right Left Upper Extremity Muscle Strength: normal normal Lower Extremity Muscle Strength: normal normal Upper Extremity Muscle Tone:  normal normal Lower Extremity  Muscle Tone: normal normal  Motor Strength Upper and lower extremity motor strength was tested in the clinically pertinent muscles.     Deep Tendon Reflexes  Right Left Biceps: normal normal Triceps: normal normal Brachiloradialis: normal normal Patellar: normal normal Achilles: normal normal  Sensory Sensation was tested at L1 to S1.   Cranial Nerves II. Optic Nerve/Visual Fields: normal III. Oculomotor: normal IV. Trochlear: normal V. Trigeminal: normal VI. Abducens: normal VII. Facial: normal VIII. Acoustic/Vestibular: normal IX. Glossopharyngeal: normal X. Vagus: normal XI. Spinal Accessory: normal XII. Hypoglossal: normal  Motor and other Tests Lhermittes: negative Rhomberg: negative    Right Left Hoffman's: normal normal Clonus: normal normal Babinski: normal normal SLR: positive at 30 degrees positive at 30 degrees Patrick's Pearlean Brownie): negative negative Toe Walk: normal normal Toe Lift: normal normal Heel Walk: normal normal SI Joint: nontender nontender   Additional Findings:  Patient has bilateral sciatic notch discomfort to palpation.  She complains of pain radiating into both her feet, left greater than right.  She also complains of loss of sensation in both of her legs.    DIAGNOSTIC RESULTS I reviewed lumbar radiographs which show prior surgery with prior fusion involving L3 through 5 levels with hardware from L4 and L5 and with degenerative changes and straightening of the lumbar spine at the L5-S1 level.  She subsequently underwent extension of fusion up to the low thoracic spine and this is T 11 level at the highest.  She had an MRI at [facility] but this is not available today.    IMPRESSION My impression is that the patient has significant lumbar pathology and low back and bilateral  lower extremity pain with intermittent urinary incontinence with significant pathology and straightening at the L5-S1 level and prior kyphosis which is being  treated with extension of fusion up to the low thoracic spine.  Completed Orders (this encounter) Order Details Reason Side Interpretation Result Initial Treatment Date Region  Lifestyle education regarding diet Encouraged to eat a well balanced diet and follow up with primary care physician.        Hypertension education Follow up with primary care physician.        Lumbar Spine- AP/Lat/Flex/Ex      12/30/2013 All Levels to All Levels   Assessment/Plan # Detail Type Description   1. 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       2. Assessment Lumbago (724.2).       3. Assessment Lumbar radiculopathy (724.4).       4. Assessment BMI 31.0-31.9,ADULT (V85.31).   Plan Orders Today's instructions / counseling include(s) Lifestyle education regarding diet.       5. Assessment Hypertension, Unspecified (401.9).         Pain Assessment/Treatment Pain Scale: 9/10. Method: Numeric Pain Intensity Scale. Location: back/legs. Onset: 06/29/2012. Duration: varies. Quality: discomforting. Pain Assessment/Treatment follow-up plan of care: Patient is currently taking medication for pain as prescribed..  I have recommended to the patient that she undergo CT myelogram of the thoracolumbar spine and we obtained AP and lateral total spine x-rays and scoliosis series and I would also like to obtain her old records from [facility] [place] [facility] well as her MRI from [facility] so that I may be able to review these studies.  Orders: Diagnostic Procedures: Assessment Procedure   Lumbar Spine- AP/Lat/Flex/Ex  724.4 Myelo/CT Lumbar Spine  737.10 Myelo/CT Thoracic Spine  737.10 Myelo/CT Thoracic Spine  737.10 Return to Clinic after study is performed  Instruction(s)/Education: Assessment Instruction  401.9 Hypertension education  V85.31 Lifestyle education regarding diet             Provider:  Danae Orleans. Venetia Maxon MD  01/05/2014 06:18 PM Dictation edited by: Danae Orleans.  [facility]    CC Providers: [facility] [address] [place], [place] [address] ----------------------------------------------------------------------------------------------------------------------------------------------------------------------         Electronically signed by Danae Orleans. Venetia Maxon MD on 01/05/2014 06:18 PM

## 2014-02-28 ENCOUNTER — Inpatient Hospital Stay (HOSPITAL_COMMUNITY): Payer: Medicare Other

## 2014-02-28 ENCOUNTER — Inpatient Hospital Stay (HOSPITAL_COMMUNITY)
Admission: RE | Admit: 2014-02-28 | Discharge: 2014-03-06 | DRG: 455 | Disposition: A | Discharge status: Home Health | Payer: Medicare Other | Source: Ambulatory Visit | Attending: Neurosurgery | Admitting: Neurosurgery

## 2014-02-28 ENCOUNTER — Inpatient Hospital Stay (HOSPITAL_COMMUNITY): Payer: Medicare Other | Admitting: Anesthesiology

## 2014-02-28 ENCOUNTER — Encounter (HOSPITAL_COMMUNITY)
Admission: RE | Disposition: A | Discharge status: Home Health | Payer: Self-pay | Source: Ambulatory Visit | Attending: Neurosurgery

## 2014-02-28 ENCOUNTER — Inpatient Hospital Stay (HOSPITAL_COMMUNITY): Payer: Medicare Other | Admitting: Emergency Medicine

## 2014-02-28 ENCOUNTER — Encounter (HOSPITAL_COMMUNITY): Payer: Self-pay | Admitting: *Deleted

## 2014-02-28 DIAGNOSIS — J449 Chronic obstructive pulmonary disease, unspecified: Secondary | ICD-10-CM | POA: Diagnosis not present

## 2014-02-28 DIAGNOSIS — M4806 Spinal stenosis, lumbar region: Secondary | ICD-10-CM | POA: Diagnosis present

## 2014-02-28 DIAGNOSIS — Z6832 Body mass index (BMI) 32.0-32.9, adult: Secondary | ICD-10-CM

## 2014-02-28 DIAGNOSIS — M479 Spondylosis, unspecified: Secondary | ICD-10-CM | POA: Diagnosis not present

## 2014-02-28 DIAGNOSIS — Z713 Dietary counseling and surveillance: Secondary | ICD-10-CM | POA: Diagnosis present

## 2014-02-28 DIAGNOSIS — E119 Type 2 diabetes mellitus without complications: Secondary | ICD-10-CM | POA: Diagnosis not present

## 2014-02-28 DIAGNOSIS — F329 Major depressive disorder, single episode, unspecified: Secondary | ICD-10-CM | POA: Diagnosis present

## 2014-02-28 DIAGNOSIS — Y831 Surgical operation with implant of artificial internal device as the cause of abnormal reaction of the patient, or of later complication, without mention of misadventure at the time of the procedure: Secondary | ICD-10-CM | POA: Diagnosis present

## 2014-02-28 DIAGNOSIS — M5137 Other intervertebral disc degeneration, lumbosacral region: Secondary | ICD-10-CM | POA: Diagnosis present

## 2014-02-28 DIAGNOSIS — I10 Essential (primary) hypertension: Secondary | ICD-10-CM | POA: Diagnosis present

## 2014-02-28 DIAGNOSIS — M8088XA Other osteoporosis with current pathological fracture, vertebra(e), initial encounter for fracture: Secondary | ICD-10-CM | POA: Diagnosis present

## 2014-02-28 DIAGNOSIS — R32 Unspecified urinary incontinence: Secondary | ICD-10-CM | POA: Diagnosis not present

## 2014-02-28 DIAGNOSIS — E785 Hyperlipidemia, unspecified: Secondary | ICD-10-CM | POA: Diagnosis present

## 2014-02-28 DIAGNOSIS — F418 Other specified anxiety disorders: Secondary | ICD-10-CM | POA: Diagnosis present

## 2014-02-28 DIAGNOSIS — M4326 Fusion of spine, lumbar region: Secondary | ICD-10-CM

## 2014-02-28 DIAGNOSIS — K219 Gastro-esophageal reflux disease without esophagitis: Secondary | ICD-10-CM | POA: Diagnosis present

## 2014-02-28 DIAGNOSIS — S32050A Wedge compression fracture of fifth lumbar vertebra, initial encounter for closed fracture: Secondary | ICD-10-CM | POA: Diagnosis not present

## 2014-02-28 DIAGNOSIS — M439 Deforming dorsopathy, unspecified: Secondary | ICD-10-CM | POA: Diagnosis present

## 2014-02-28 DIAGNOSIS — M40209 Unspecified kyphosis, site unspecified: Secondary | ICD-10-CM | POA: Diagnosis not present

## 2014-02-28 HISTORY — PX: ANTERIOR LUMBAR FUSION: SHX1170

## 2014-02-28 HISTORY — PX: ABDOMINAL EXPOSURE: SHX5708

## 2014-02-28 LAB — GLUCOSE, CAPILLARY
GLUCOSE-CAPILLARY: 92 mg/dL (ref 70–99)
GLUCOSE-CAPILLARY: 104 mg/dL — AB (ref 70–99)
Glucose-Capillary: 115 mg/dL — ABNORMAL HIGH (ref 70–99)

## 2014-02-28 SURGERY — ANTERIOR LUMBAR FUSION 1 LEVEL
Anesthesia: General | Site: Abdomen

## 2014-02-28 SURGERY — ANTERIOR LUMBAR FUSION 1 LEVEL
Anesthesia: General

## 2014-02-28 MED ORDER — HYDROCODONE-ACETAMINOPHEN 5-325 MG PO TABS: 1.0000 | ORAL_TABLET | ORAL | Status: DC | PRN

## 2014-02-28 MED ORDER — VECURONIUM BROMIDE 10 MG IV SOLR
INTRAVENOUS | Status: DC | PRN
  Administered 2014-02-28 (×2): 5 mg via INTRAVENOUS
  Administered 2014-02-28: 2 mg via INTRAVENOUS
  Administered 2014-02-28: 1 mg via INTRAVENOUS

## 2014-02-28 MED ORDER — VANCOMYCIN HCL IN DEXTROSE 1-5 GM/200ML-% IV SOLN
1000.0000 mg | Freq: Once | INTRAVENOUS | Status: AC
  Administered 2014-02-28: 1000 mg via INTRAVENOUS
  Filled 2014-02-28: qty 200

## 2014-02-28 MED ORDER — ACETAMINOPHEN 650 MG RE SUPP
650.0000 mg | RECTAL | Status: DC | PRN
  Administered 2014-03-03: 650 mg via RECTAL
  Filled 2014-02-28: qty 1

## 2014-02-28 MED ORDER — ARTIFICIAL TEARS OP OINT
TOPICAL_OINTMENT | OPHTHALMIC | Status: DC | PRN
  Administered 2014-02-28: 1 via OPHTHALMIC

## 2014-02-28 MED ORDER — OXYCODONE HCL 5 MG PO TABS: 5.0000 mg | ORAL_TABLET | Freq: Once | ORAL | Status: DC | PRN

## 2014-02-28 MED ORDER — LACTATED RINGERS IV SOLN
INTRAVENOUS | Status: DC | PRN
  Administered 2014-02-28: 09:00:00 via INTRAVENOUS

## 2014-02-28 MED ORDER — BISACODYL 10 MG RE SUPP: 10.0000 mg | Freq: Every day | RECTAL | Status: DC | PRN

## 2014-02-28 MED ORDER — VECURONIUM BROMIDE 10 MG IV SOLR
INTRAVENOUS | Status: AC
  Filled 2014-02-28: qty 20

## 2014-02-28 MED ORDER — PHENYLEPHRINE HCL 10 MG/ML IJ SOLN
10.0000 mg | INTRAVENOUS | Status: DC | PRN
  Administered 2014-02-28: 15 ug/min via INTRAVENOUS

## 2014-02-28 MED ORDER — PANTOPRAZOLE SODIUM 40 MG PO TBEC
40.0000 mg | DELAYED_RELEASE_TABLET | Freq: Every day | ORAL | Status: DC
  Administered 2014-03-01 – 2014-03-06 (×6): 40 mg via ORAL
  Filled 2014-02-28 (×6): qty 1

## 2014-02-28 MED ORDER — VANCOMYCIN HCL IN DEXTROSE 1-5 GM/200ML-% IV SOLN
INTRAVENOUS | Status: AC
  Filled 2014-02-28: qty 200

## 2014-02-28 MED ORDER — ONDANSETRON HCL 4 MG/2ML IJ SOLN
INTRAMUSCULAR | Status: AC
  Filled 2014-02-28: qty 2

## 2014-02-28 MED ORDER — IBANDRONATE SODIUM 150 MG PO TABS: 150.0000 mg | ORAL_TABLET | ORAL | Status: DC

## 2014-02-28 MED ORDER — LIDOCAINE HCL (CARDIAC) 20 MG/ML IV SOLN
INTRAVENOUS | Status: DC | PRN
  Administered 2014-02-28: 60 mg via INTRAVENOUS

## 2014-02-28 MED ORDER — SUFENTANIL CITRATE 50 MCG/ML IV SOLN
INTRAVENOUS | Status: AC
  Filled 2014-02-28: qty 1

## 2014-02-28 MED ORDER — 0.9 % SODIUM CHLORIDE (POUR BTL) OPTIME
TOPICAL | Status: DC | PRN
  Administered 2014-02-28: 1000 mL

## 2014-02-28 MED ORDER — ARIPIPRAZOLE 10 MG PO TABS
10.0000 mg | ORAL_TABLET | Freq: Every day | ORAL | Status: DC
  Administered 2014-03-01 – 2014-03-06 (×6): 10 mg via ORAL
  Filled 2014-02-28 (×6): qty 1

## 2014-02-28 MED ORDER — GLYCOPYRROLATE 0.2 MG/ML IJ SOLN
INTRAMUSCULAR | Status: DC | PRN
  Administered 2014-02-28: 0.6 mg via INTRAVENOUS

## 2014-02-28 MED ORDER — THROMBIN 5000 UNITS EX SOLR
CUTANEOUS | Status: DC | PRN
  Administered 2014-02-28 (×2): 5000 [IU] via TOPICAL

## 2014-02-28 MED ORDER — SENNA 8.6 MG PO TABS
1.0000 | ORAL_TABLET | Freq: Two times a day (BID) | ORAL | Status: DC
  Administered 2014-02-28 – 2014-03-06 (×9): 8.6 mg via ORAL
  Filled 2014-02-28 (×13): qty 1

## 2014-02-28 MED ORDER — LOPERAMIDE HCL 2 MG PO CAPS
2.0000 mg | ORAL_CAPSULE | Freq: Every day | ORAL | Status: DC
Start: 2014-03-01 — End: 2014-03-06
  Administered 2014-03-01 – 2014-03-06 (×3): 2 mg via ORAL
  Filled 2014-02-28 (×6): qty 1

## 2014-02-28 MED ORDER — ZOLPIDEM TARTRATE 5 MG PO TABS: 5.0000 mg | ORAL_TABLET | Freq: Every evening | ORAL | Status: DC | PRN

## 2014-02-28 MED ORDER — SODIUM CHLORIDE 0.9 % IJ SOLN
3.0000 mL | Freq: Two times a day (BID) | INTRAMUSCULAR | Status: DC
  Administered 2014-02-28 – 2014-03-05 (×8): 3 mL via INTRAVENOUS

## 2014-02-28 MED ORDER — CEFAZOLIN SODIUM-DEXTROSE 2-3 GM-% IV SOLR
INTRAVENOUS | Status: AC
  Filled 2014-02-28: qty 50

## 2014-02-28 MED ORDER — QUININE SULFATE 324 MG PO CAPS
648.0000 mg | ORAL_CAPSULE | Freq: Three times a day (TID) | ORAL | Status: DC
  Administered 2014-02-28 – 2014-03-06 (×16): 648 mg via ORAL
  Filled 2014-02-28 (×20): qty 2

## 2014-02-28 MED ORDER — HYDROMORPHONE HCL 1 MG/ML IJ SOLN
0.2500 mg | INTRAMUSCULAR | Status: DC | PRN
  Administered 2014-02-28 (×2): 0.5 mg via INTRAVENOUS

## 2014-02-28 MED ORDER — STERILE WATER FOR INJECTION IJ SOLN
INTRAMUSCULAR | Status: AC
  Filled 2014-02-28: qty 10

## 2014-02-28 MED ORDER — LORATADINE 10 MG PO TABS
10.0000 mg | ORAL_TABLET | Freq: Every day | ORAL | Status: DC
  Administered 2014-02-28 – 2014-03-06 (×7): 10 mg via ORAL
  Filled 2014-02-28 (×7): qty 1

## 2014-02-28 MED ORDER — DEXAMETHASONE SODIUM PHOSPHATE 4 MG/ML IJ SOLN
INTRAMUSCULAR | Status: DC | PRN
  Administered 2014-02-28: 8 mg via INTRAVENOUS

## 2014-02-28 MED ORDER — DOCUSATE SODIUM 100 MG PO CAPS
100.0000 mg | ORAL_CAPSULE | Freq: Two times a day (BID) | ORAL | Status: DC
  Administered 2014-02-28 – 2014-03-06 (×10): 100 mg via ORAL
  Filled 2014-02-28 (×13): qty 1

## 2014-02-28 MED ORDER — HYDROMORPHONE HCL 1 MG/ML IJ SOLN
0.5000 mg | INTRAMUSCULAR | Status: DC | PRN
  Administered 2014-02-28 – 2014-03-02 (×7): 1 mg via INTRAVENOUS
  Filled 2014-02-28 (×7): qty 1

## 2014-02-28 MED ORDER — LOPERAMIDE HCL 2 MG PO TABS: 2.0000 mg | ORAL_TABLET | Freq: Every day | ORAL | Status: DC

## 2014-02-28 MED ORDER — MENTHOL 3 MG MT LOZG: 1.0000 | LOZENGE | OROMUCOSAL | Status: DC | PRN

## 2014-02-28 MED ORDER — MIDAZOLAM HCL 5 MG/5ML IJ SOLN
INTRAMUSCULAR | Status: DC | PRN
  Administered 2014-02-28 (×2): 1 mg via INTRAVENOUS

## 2014-02-28 MED ORDER — CYANOCOBALAMIN 1000 MCG/ML IJ SOLN
1000.0000 ug | INTRAMUSCULAR | Status: DC
  Administered 2014-03-02: 1000 ug via INTRAMUSCULAR
  Filled 2014-02-28 (×2): qty 1

## 2014-02-28 MED ORDER — OXYCODONE-ACETAMINOPHEN 7.5-325 MG PO TABS: 1.0000 | ORAL_TABLET | ORAL | Status: DC | PRN

## 2014-02-28 MED ORDER — KCL IN DEXTROSE-NACL 20-5-0.45 MEQ/L-%-% IV SOLN
INTRAVENOUS | Status: DC
  Administered 2014-02-28 – 2014-03-01 (×3): via INTRAVENOUS
  Filled 2014-02-28 (×5): qty 1000

## 2014-02-28 MED ORDER — METAXALONE 800 MG PO TABS
800.0000 mg | ORAL_TABLET | Freq: Three times a day (TID) | ORAL | Status: DC
  Administered 2014-02-28 – 2014-03-06 (×16): 800 mg via ORAL
  Filled 2014-02-28 (×20): qty 1

## 2014-02-28 MED ORDER — CARBOXYMETHYLCELL-HYPROMELLOSE 0.25-0.3 % OP GEL: 1.0000 [drp] | Freq: Two times a day (BID) | OPHTHALMIC | Status: DC | PRN

## 2014-02-28 MED ORDER — NEOSTIGMINE METHYLSULFATE 10 MG/10ML IV SOLN
INTRAVENOUS | Status: AC
  Filled 2014-02-28: qty 1

## 2014-02-28 MED ORDER — VANCOMYCIN HCL 1000 MG IV SOLR
1000.0000 mg | INTRAVENOUS | Status: DC | PRN
  Administered 2014-02-28: 1000 mg via INTRAVENOUS

## 2014-02-28 MED ORDER — PHENYLEPHRINE HCL 10 MG/ML IJ SOLN
INTRAMUSCULAR | Status: DC | PRN
  Administered 2014-02-28: 40 ug via INTRAVENOUS
  Administered 2014-02-28 (×2): 80 ug via INTRAVENOUS

## 2014-02-28 MED ORDER — PHENOL 1.4 % MT LIQD: 1.0000 | OROMUCOSAL | Status: DC | PRN

## 2014-02-28 MED ORDER — PROPOFOL 10 MG/ML IV BOLUS
INTRAVENOUS | Status: AC
  Filled 2014-02-28: qty 20

## 2014-02-28 MED ORDER — FENTANYL CITRATE 0.05 MG/ML IJ SOLN
INTRAMUSCULAR | Status: DC | PRN
  Administered 2014-02-28: 100 ug via INTRAVENOUS

## 2014-02-28 MED ORDER — ONDANSETRON HCL 4 MG/2ML IJ SOLN
INTRAMUSCULAR | Status: DC | PRN
  Administered 2014-02-28: 4 mg via INTRAVENOUS

## 2014-02-28 MED ORDER — AMLODIPINE BESYLATE 2.5 MG PO TABS
2.5000 mg | ORAL_TABLET | Freq: Every day | ORAL | Status: DC
  Administered 2014-02-28: 2.5 mg via ORAL
  Filled 2014-02-28 (×5): qty 1

## 2014-02-28 MED ORDER — KETAMINE HCL 100 MG/ML IJ SOLN
INTRAMUSCULAR | Status: AC
  Filled 2014-02-28: qty 1

## 2014-02-28 MED ORDER — PHENYLEPHRINE 40 MCG/ML (10ML) SYRINGE FOR IV PUSH (FOR BLOOD PRESSURE SUPPORT)
PREFILLED_SYRINGE | INTRAVENOUS | Status: AC
  Filled 2014-02-28: qty 10

## 2014-02-28 MED ORDER — BUDESONIDE-FORMOTEROL FUMARATE 160-4.5 MCG/ACT IN AERO
2.0000 | INHALATION_SPRAY | Freq: Two times a day (BID) | RESPIRATORY_TRACT | Status: DC
  Administered 2014-02-28 – 2014-03-06 (×10): 2 via RESPIRATORY_TRACT
  Filled 2014-02-28 (×3): qty 6

## 2014-02-28 MED ORDER — OXYCODONE-ACETAMINOPHEN 5-325 MG PO TABS
1.0000 | ORAL_TABLET | ORAL | Status: DC | PRN
  Administered 2014-02-28: 1 via ORAL
  Administered 2014-03-01 – 2014-03-05 (×13): 2 via ORAL
  Administered 2014-03-05: 1 via ORAL
  Administered 2014-03-05 – 2014-03-06 (×4): 2 via ORAL
  Filled 2014-02-28 (×10): qty 2
  Filled 2014-02-28 (×3): qty 1
  Filled 2014-02-28 (×7): qty 2

## 2014-02-28 MED ORDER — SODIUM CHLORIDE 0.9 % IJ SOLN
3.0000 mL | INTRAMUSCULAR | Status: DC | PRN
  Administered 2014-03-02: 3 mL via INTRAVENOUS
  Filled 2014-02-28: qty 3

## 2014-02-28 MED ORDER — LIDOCAINE HCL (CARDIAC) 20 MG/ML IV SOLN
INTRAVENOUS | Status: AC
  Filled 2014-02-28: qty 5

## 2014-02-28 MED ORDER — ROCURONIUM BROMIDE 100 MG/10ML IV SOLN
INTRAVENOUS | Status: DC | PRN
  Administered 2014-02-28: 50 mg via INTRAVENOUS

## 2014-02-28 MED ORDER — PROMETHAZINE HCL 25 MG PO TABS: 25.0000 mg | ORAL_TABLET | Freq: Three times a day (TID) | ORAL | Status: DC | PRN

## 2014-02-28 MED ORDER — VITAMIN D (ERGOCALCIFEROL) 1.25 MG (50000 UNIT) PO CAPS
50000.0000 [IU] | ORAL_CAPSULE | ORAL | Status: DC
  Administered 2014-03-03 – 2014-03-06 (×2): 50000 [IU] via ORAL
  Filled 2014-02-28 (×2): qty 1

## 2014-02-28 MED ORDER — LEVOCETIRIZINE DIHYDROCHLORIDE 5 MG PO TABS
5.0000 mg | ORAL_TABLET | Freq: Every day | ORAL | Status: DC
  Filled 2014-02-28: qty 1

## 2014-02-28 MED ORDER — POLYVINYL ALCOHOL 1.4 % OP SOLN
1.0000 [drp] | Freq: Two times a day (BID) | OPHTHALMIC | Status: DC | PRN
  Filled 2014-02-28: qty 15

## 2014-02-28 MED ORDER — BUPIVACAINE HCL 0.5 % IJ SOLN
INTRAMUSCULAR | Status: DC | PRN
  Administered 2014-02-28: 10 mL

## 2014-02-28 MED ORDER — NEOSTIGMINE METHYLSULFATE 10 MG/10ML IV SOLN
INTRAVENOUS | Status: DC | PRN
  Administered 2014-02-28: 4 mg via INTRAVENOUS

## 2014-02-28 MED ORDER — CHLORHEXIDINE GLUCONATE 4 % EX LIQD
60.0000 mL | Freq: Once | CUTANEOUS | Status: DC
  Filled 2014-02-28: qty 60

## 2014-02-28 MED ORDER — LIDOCAINE-EPINEPHRINE 1 %-1:100000 IJ SOLN
INTRAMUSCULAR | Status: DC | PRN
  Administered 2014-02-28: 10 mL

## 2014-02-28 MED ORDER — BIOTIN 5 MG PO CAPS: 5.0000 mg | ORAL_CAPSULE | Freq: Every day | ORAL | Status: DC

## 2014-02-28 MED ORDER — PROPOFOL 10 MG/ML IV BOLUS
INTRAVENOUS | Status: DC | PRN
  Administered 2014-02-28: 90 mg via INTRAVENOUS
  Administered 2014-02-28: 50 mg via INTRAVENOUS

## 2014-02-28 MED ORDER — LACTATED RINGERS IV SOLN
INTRAVENOUS | Status: DC | PRN
  Administered 2014-02-28 (×3): via INTRAVENOUS

## 2014-02-28 MED ORDER — TIOTROPIUM BROMIDE MONOHYDRATE 18 MCG IN CAPS
18.0000 ug | ORAL_CAPSULE | Freq: Every day | RESPIRATORY_TRACT | Status: DC
  Administered 2014-03-01 – 2014-03-06 (×4): 18 ug via RESPIRATORY_TRACT
  Filled 2014-02-28: qty 5

## 2014-02-28 MED ORDER — METOPROLOL TARTRATE 25 MG PO TABS
25.0000 mg | ORAL_TABLET | Freq: Once | ORAL | Status: AC
  Administered 2014-02-28: 25 mg via ORAL
  Filled 2014-02-28: qty 1

## 2014-02-28 MED ORDER — ARTIFICIAL TEARS OP OINT
TOPICAL_OINTMENT | OPHTHALMIC | Status: AC
  Filled 2014-02-28: qty 3.5

## 2014-02-28 MED ORDER — GLYCOPYRROLATE 0.2 MG/ML IJ SOLN
INTRAMUSCULAR | Status: AC
  Filled 2014-02-28: qty 3

## 2014-02-28 MED ORDER — THROMBIN 20000 UNITS EX SOLR
CUTANEOUS | Status: AC
  Filled 2014-02-28: qty 20000

## 2014-02-28 MED ORDER — FLEET ENEMA 7-19 GM/118ML RE ENEM
1.0000 | ENEMA | Freq: Once | RECTAL | Status: AC | PRN
Start: 2014-02-28 — End: 2014-02-28
  Filled 2014-02-28: qty 1

## 2014-02-28 MED ORDER — AMITRIPTYLINE HCL 25 MG PO TABS
75.0000 mg | ORAL_TABLET | Freq: Every day | ORAL | Status: DC
  Administered 2014-02-28 – 2014-03-05 (×6): 75 mg via ORAL
  Filled 2014-02-28 (×3): qty 3
  Filled 2014-02-28: qty 1
  Filled 2014-02-28: qty 3
  Filled 2014-02-28: qty 1
  Filled 2014-02-28: qty 3
  Filled 2014-02-28: qty 1

## 2014-02-28 MED ORDER — METOPROLOL TARTRATE 25 MG PO TABS
25.0000 mg | ORAL_TABLET | Freq: Two times a day (BID) | ORAL | Status: DC
  Administered 2014-02-28: 25 mg via ORAL
  Filled 2014-02-28 (×9): qty 1

## 2014-02-28 MED ORDER — HEMOSTATIC AGENTS (NO CHARGE) OPTIME
TOPICAL | Status: DC | PRN
  Administered 2014-02-28: 1 via TOPICAL

## 2014-02-28 MED ORDER — OXYCODONE HCL 5 MG/5ML PO SOLN: 5.0000 mg | Freq: Once | ORAL | Status: DC | PRN

## 2014-02-28 MED ORDER — LORAZEPAM 1 MG PO TABS
1.0000 mg | ORAL_TABLET | Freq: Three times a day (TID) | ORAL | Status: DC | PRN
  Administered 2014-03-01 – 2014-03-05 (×2): 1 mg via ORAL
  Filled 2014-02-28 (×2): qty 1

## 2014-02-28 MED ORDER — FENTANYL CITRATE 0.05 MG/ML IJ SOLN
INTRAMUSCULAR | Status: AC
  Filled 2014-02-28: qty 5

## 2014-02-28 MED ORDER — ACETAMINOPHEN 325 MG PO TABS
650.0000 mg | ORAL_TABLET | ORAL | Status: DC | PRN
  Filled 2014-02-28: qty 2

## 2014-02-28 MED ORDER — ALUM & MAG HYDROXIDE-SIMETH 200-200-20 MG/5ML PO SUSP: 30.0000 mL | Freq: Four times a day (QID) | ORAL | Status: DC | PRN

## 2014-02-28 MED ORDER — FENOFIBRATE 54 MG PO TABS
54.0000 mg | ORAL_TABLET | Freq: Every day | ORAL | Status: DC
  Administered 2014-02-28 – 2014-03-06 (×7): 54 mg via ORAL
  Filled 2014-02-28 (×7): qty 1

## 2014-02-28 MED ORDER — METHOCARBAMOL 500 MG PO TABS
500.0000 mg | ORAL_TABLET | Freq: Four times a day (QID) | ORAL | Status: DC | PRN
  Administered 2014-03-05: 500 mg via ORAL
  Filled 2014-02-28 (×3): qty 1

## 2014-02-28 MED ORDER — SODIUM CHLORIDE 0.9 % IV SOLN
500.0000 mg | INTRAVENOUS | Status: DC | PRN
  Administered 2014-02-28: 10 ug/kg/min via INTRAVENOUS

## 2014-02-28 MED ORDER — EPHEDRINE SULFATE 50 MG/ML IJ SOLN
INTRAMUSCULAR | Status: DC | PRN
  Administered 2014-02-28 (×5): 10 mg via INTRAVENOUS
  Administered 2014-02-28: 5 mg via INTRAVENOUS

## 2014-02-28 MED ORDER — FLUTICASONE PROPIONATE 50 MCG/ACT NA SUSP
2.0000 | Freq: Every day | NASAL | Status: DC
  Administered 2014-03-01 – 2014-03-06 (×6): 2 via NASAL
  Filled 2014-02-28: qty 16

## 2014-02-28 MED ORDER — GABAPENTIN 300 MG PO CAPS
900.0000 mg | ORAL_CAPSULE | Freq: Three times a day (TID) | ORAL | Status: DC
  Administered 2014-02-28 – 2014-03-06 (×16): 900 mg via ORAL
  Filled 2014-02-28: qty 9
  Filled 2014-02-28 (×17): qty 3

## 2014-02-28 MED ORDER — LACTATED RINGERS IV SOLN
INTRAVENOUS | Status: DC | PRN
  Administered 2014-02-28 (×2): via INTRAVENOUS

## 2014-02-28 MED ORDER — ROCURONIUM BROMIDE 50 MG/5ML IV SOLN
INTRAVENOUS | Status: AC
  Filled 2014-02-28: qty 1

## 2014-02-28 MED ORDER — HYDROMORPHONE HCL 1 MG/ML IJ SOLN
INTRAMUSCULAR | Status: AC
  Filled 2014-02-28: qty 1

## 2014-02-28 MED ORDER — MIDAZOLAM HCL 2 MG/2ML IJ SOLN
INTRAMUSCULAR | Status: AC
  Filled 2014-02-28: qty 2

## 2014-02-28 MED ORDER — METHOCARBAMOL 1000 MG/10ML IJ SOLN
500.0000 mg | Freq: Four times a day (QID) | INTRAVENOUS | Status: DC | PRN
  Filled 2014-02-28: qty 5

## 2014-02-28 MED ORDER — THROMBIN 5000 UNITS EX SOLR
CUTANEOUS | Status: AC
  Filled 2014-02-28: qty 5000

## 2014-02-28 MED ORDER — CEFAZOLIN SODIUM-DEXTROSE 2-3 GM-% IV SOLR
INTRAVENOUS | Status: DC | PRN
  Administered 2014-02-28 (×2): 2 g via INTRAVENOUS

## 2014-02-28 MED ORDER — METOPROLOL TARTRATE 12.5 MG HALF TABLET
ORAL_TABLET | ORAL | Status: AC
  Filled 2014-02-28: qty 2

## 2014-02-28 MED ORDER — ATROPINE SULFATE 0.1 MG/ML IJ SOLN
INTRAMUSCULAR | Status: AC
  Filled 2014-02-28: qty 10

## 2014-02-28 MED ORDER — SODIUM CHLORIDE 0.9 % IV SOLN: 250.0000 mL | INTRAVENOUS | Status: DC

## 2014-02-28 MED ORDER — ONDANSETRON HCL 4 MG/2ML IJ SOLN: 4.0000 mg | INTRAMUSCULAR | Status: DC | PRN

## 2014-02-28 MED ORDER — MEMANTINE HCL 10 MG PO TABS
10.0000 mg | ORAL_TABLET | Freq: Two times a day (BID) | ORAL | Status: DC
  Administered 2014-02-28 – 2014-03-06 (×12): 10 mg via ORAL
  Filled 2014-02-28 (×14): qty 1

## 2014-02-28 MED ORDER — POLYETHYLENE GLYCOL 3350 17 G PO PACK
17.0000 g | PACK | Freq: Every day | ORAL | Status: DC | PRN
  Filled 2014-02-28: qty 1

## 2014-02-28 MED ORDER — SUFENTANIL CITRATE 250 MCG/5ML IV SOLN
250.0000 ug | INTRAVENOUS | Status: DC | PRN
  Administered 2014-02-28: .25 ug/kg/h via INTRAVENOUS
  Administered 2014-02-28 (×2): 3 ug via INTRAVENOUS

## 2014-02-28 MED ORDER — POLYETHYLENE GLYCOL 3350 17 G PO PACK: 17.0000 g | PACK | Freq: Every day | ORAL | Status: DC | PRN

## 2014-02-28 SURGICAL SUPPLY — 85 items
APPLIER CLIP 11 MED OPEN (CLIP) ×6
BUR BARREL STRAIGHT FLUTE 4.0 (BURR) IMPLANT
CANISTER SUCT 3000ML (MISCELLANEOUS) ×3 IMPLANT
CLIP APPLIE 11 MED OPEN (CLIP) ×2 IMPLANT
CONT SPEC 4OZ CLIKSEAL STRL BL (MISCELLANEOUS) ×3 IMPLANT
COVER BACK TABLE 24X17X13 BIG (DRAPES) IMPLANT
DECANTER SPIKE VIAL GLASS SM (MISCELLANEOUS) ×3 IMPLANT
DRAPE C-ARM 42X72 X-RAY (DRAPES) ×6 IMPLANT
DRAPE INCISE IOBAN 66X45 STRL (DRAPES) IMPLANT
DRAPE LAPAROTOMY 100X72X124 (DRAPES) ×3 IMPLANT
DRAPE POUCH INSTRU U-SHP 10X18 (DRAPES) ×3 IMPLANT
DRSG TELFA 3X8 NADH (GAUZE/BANDAGES/DRESSINGS) IMPLANT
DURAPREP 26ML APPLICATOR (WOUND CARE) ×3 IMPLANT
ELECT BLADE 4.0 EZ CLEAN MEGAD (MISCELLANEOUS) ×3
ELECT REM PT RETURN 9FT ADLT (ELECTROSURGICAL) ×3
ELECTRODE BLDE 4.0 EZ CLN MEGD (MISCELLANEOUS) ×1 IMPLANT
ELECTRODE REM PT RTRN 9FT ADLT (ELECTROSURGICAL) ×1 IMPLANT
GAUZE SPONGE 4X4 12PLY STRL (GAUZE/BANDAGES/DRESSINGS) IMPLANT
GAUZE SPONGE 4X4 16PLY XRAY LF (GAUZE/BANDAGES/DRESSINGS) IMPLANT
GLOVE BIO SURGEON STRL SZ8 (GLOVE) ×3 IMPLANT
GLOVE BIOGEL PI IND STRL 8 (GLOVE) IMPLANT
GLOVE BIOGEL PI IND STRL 8.5 (GLOVE) ×1 IMPLANT
GLOVE BIOGEL PI INDICATOR 8 (GLOVE)
GLOVE BIOGEL PI INDICATOR 8.5 (GLOVE) ×2
GLOVE ECLIPSE 7.5 STRL STRAW (GLOVE) IMPLANT
GLOVE EXAM NITRILE LRG STRL (GLOVE) IMPLANT
GLOVE EXAM NITRILE MD LF STRL (GLOVE) IMPLANT
GLOVE EXAM NITRILE XL STR (GLOVE) IMPLANT
GLOVE EXAM NITRILE XS STR PU (GLOVE) IMPLANT
GLOVE SS BIOGEL STRL SZ 7.5 (GLOVE) ×1 IMPLANT
GLOVE SUPERSENSE BIOGEL SZ 7.5 (GLOVE) ×2
GOWN STRL NON-REIN LRG LVL3 (GOWN DISPOSABLE) ×3 IMPLANT
GOWN STRL REUS W/ TWL LRG LVL3 (GOWN DISPOSABLE) IMPLANT
GOWN STRL REUS W/ TWL XL LVL3 (GOWN DISPOSABLE) IMPLANT
GOWN STRL REUS W/TWL 2XL LVL3 (GOWN DISPOSABLE) IMPLANT
GOWN STRL REUS W/TWL LRG LVL3 (GOWN DISPOSABLE)
GOWN STRL REUS W/TWL XL LVL3 (GOWN DISPOSABLE)
INSERT FOGARTY 61MM (MISCELLANEOUS) IMPLANT
INSERT FOGARTY SM (MISCELLANEOUS) IMPLANT
KIT BASIN OR (CUSTOM PROCEDURE TRAY) ×3 IMPLANT
KIT ROOM TURNOVER OR (KITS) ×6 IMPLANT
LIQUID BAND (GAUZE/BANDAGES/DRESSINGS) ×6 IMPLANT
LOOP VESSEL MAXI BLUE (MISCELLANEOUS) IMPLANT
LOOP VESSEL MINI RED (MISCELLANEOUS) IMPLANT
NEEDLE HYPO 25X1 1.5 SAFETY (NEEDLE) IMPLANT
NEEDLE SPNL 18GX3.5 QUINCKE PK (NEEDLE) ×3 IMPLANT
NS IRRIG 1000ML POUR BTL (IV SOLUTION) ×3 IMPLANT
PACK LAMINECTOMY NEURO (CUSTOM PROCEDURE TRAY) ×3 IMPLANT
PAD ARMBOARD 7.5X6 YLW CONV (MISCELLANEOUS) ×6 IMPLANT
SPONGE INTESTINAL PEANUT (DISPOSABLE) ×15 IMPLANT
SPONGE LAP 18X18 X RAY DECT (DISPOSABLE) ×3 IMPLANT
SPONGE LAP 4X18 X RAY DECT (DISPOSABLE) IMPLANT
SPONGE SURGIFOAM ABS GEL SZ50 (HEMOSTASIS) IMPLANT
STAPLER VISISTAT 35W (STAPLE) IMPLANT
SUT MNCRL AB 4-0 PS2 18 (SUTURE) ×3 IMPLANT
SUT PROLENE 4 0 RB 1 (SUTURE) ×8
SUT PROLENE 4-0 RB1 .5 CRCL 36 (SUTURE) ×4 IMPLANT
SUT PROLENE 5 0 CC1 (SUTURE) IMPLANT
SUT PROLENE 6 0 C 1 30 (SUTURE) ×3 IMPLANT
SUT PROLENE 6 0 CC (SUTURE) IMPLANT
SUT SILK 0 TIES 10X30 (SUTURE) ×3 IMPLANT
SUT SILK 2 0 TIES 10X30 (SUTURE) ×3 IMPLANT
SUT SILK 2 0 TIES 17X18 (SUTURE) ×2
SUT SILK 2 0SH CR/8 30 (SUTURE) IMPLANT
SUT SILK 2-0 18XBRD TIE BLK (SUTURE) ×1 IMPLANT
SUT SILK 3 0 TIES 10X30 (SUTURE) ×3 IMPLANT
SUT SILK 3 0SH CR/8 30 (SUTURE) IMPLANT
SUT VIC AB 0 CT1 27 (SUTURE) ×6
SUT VIC AB 0 CT1 27XBRD ANBCTR (SUTURE) ×3 IMPLANT
SUT VIC AB 1 CT1 18XBRD ANBCTR (SUTURE) ×1 IMPLANT
SUT VIC AB 1 CT1 8-18 (SUTURE) ×2
SUT VIC AB 2-0 CT1 18 (SUTURE) ×3 IMPLANT
SUT VIC AB 2-0 CT1 27 (SUTURE) ×2
SUT VIC AB 2-0 CT1 27XBRD (SUTURE) ×1 IMPLANT
SUT VIC AB 2-0 CT1 36 (SUTURE) ×3 IMPLANT
SUT VIC AB 3-0 SH 27 (SUTURE) ×2
SUT VIC AB 3-0 SH 27X BRD (SUTURE) ×1 IMPLANT
SUT VIC AB 3-0 SH 8-18 (SUTURE) ×3 IMPLANT
SUT VICRYL 4-0 PS2 18IN ABS (SUTURE) IMPLANT
SYR 20ML ECCENTRIC (SYRINGE) ×3 IMPLANT
TOWEL OR 17X24 6PK STRL BLUE (TOWEL DISPOSABLE) ×6 IMPLANT
TOWEL OR 17X26 10 PK STRL BLUE (TOWEL DISPOSABLE) ×6 IMPLANT
TRAP SPECIMEN MUCOUS 40CC (MISCELLANEOUS) ×3 IMPLANT
TRAY FOLEY CATH 14FRSI W/METER (CATHETERS) ×3 IMPLANT
WATER STERILE IRR 1000ML POUR (IV SOLUTION) ×3 IMPLANT

## 2014-02-28 SURGICAL SUPPLY — 85 items
ATTRACTOMAT 16X20 MAGNETIC DRP (DRAPES) ×5 IMPLANT
BLADE 10 SAFETY STRL DISP (BLADE) ×5 IMPLANT
BONE CANC CHIPS 40CC CAN1/2 (Bone Implant) ×5 IMPLANT
BUR MATCHSTICK NEURO 3.0 LAGG (BURR) ×5 IMPLANT
BUR ROUND FLUTED 5 RND (BURR) ×4 IMPLANT
BUR ROUND FLUTED 5MM RND (BURR) ×1
CHIPS CANC BONE 40CC CAN1/2 (Bone Implant) ×3 IMPLANT
CLIP LIGATING EXTRA MED SLVR (CLIP) IMPLANT
CLIP LIGATING EXTRA SM BLUE (MISCELLANEOUS) ×5 IMPLANT
COUNTER NEEDLE 20 DBL MAG RED (NEEDLE) ×5 IMPLANT
COVER SURGICAL LIGHT HANDLE (MISCELLANEOUS) ×5 IMPLANT
DERMABOND ADVANCED (GAUZE/BANDAGES/DRESSINGS) ×4
DERMABOND ADVANCED .7 DNX12 (GAUZE/BANDAGES/DRESSINGS) ×6 IMPLANT
DISPOSABLE CLIP-ON REMOTE CONTROL ×5 IMPLANT
DRAPE INCISE IOBAN 66X45 STRL (DRAPES) ×10 IMPLANT
DRSG OPSITE POSTOP 4X10 (GAUZE/BANDAGES/DRESSINGS) ×5 IMPLANT
DRSG OPSITE POSTOP 4X8 (GAUZE/BANDAGES/DRESSINGS) ×5 IMPLANT
ELECT BLADE 4.0 EZ CLEAN MEGAD (MISCELLANEOUS) ×5
ELECTRODE BLDE 4.0 EZ CLN MEGD (MISCELLANEOUS) ×3 IMPLANT
GLOVE BIOGEL PI IND STRL 6.5 (GLOVE) ×3 IMPLANT
GLOVE BIOGEL PI IND STRL 7.0 (GLOVE) ×6 IMPLANT
GLOVE BIOGEL PI IND STRL 8.5 (GLOVE) ×3 IMPLANT
GLOVE BIOGEL PI INDICATOR 6.5 (GLOVE) ×2
GLOVE BIOGEL PI INDICATOR 7.0 (GLOVE) ×4
GLOVE BIOGEL PI INDICATOR 8.5 (GLOVE) ×2
GLOVE ECLIPSE 6.5 STRL STRAW (GLOVE) ×5 IMPLANT
GLOVE ECLIPSE 8.5 STRL (GLOVE) ×5 IMPLANT
GLOVE SS BIOGEL STRL SZ 7.5 (GLOVE) ×6 IMPLANT
GLOVE SS N UNI LF 7.0 STRL (GLOVE) ×30 IMPLANT
GLOVE SUPERSENSE BIOGEL SZ 7.5 (GLOVE) ×4
GOWN STRL REUS W/ TWL LRG LVL3 (GOWN DISPOSABLE) ×12 IMPLANT
GOWN STRL REUS W/ TWL XL LVL3 (GOWN DISPOSABLE) ×24 IMPLANT
GOWN STRL REUS W/TWL LRG LVL3 (GOWN DISPOSABLE) ×8
GOWN STRL REUS W/TWL XL LVL3 (GOWN DISPOSABLE) ×16
IMPLANT BRIGADE 10X38X28MM-8 (Cage) ×5 IMPLANT
INSERT FOGARTY 61MM (MISCELLANEOUS) IMPLANT
INSERT FOGARTY SM (MISCELLANEOUS) IMPLANT
KIT INFUSE MEDIUM (Orthopedic Implant) ×5 IMPLANT
KIT INFUSE X SMALL 1.4CC (Orthopedic Implant) ×5 IMPLANT
KIT ROOM TURNOVER OR (KITS) ×10 IMPLANT
LIQUID BAND (GAUZE/BANDAGES/DRESSINGS) ×10 IMPLANT
LOOP VESSEL MAXI BLUE (MISCELLANEOUS) IMPLANT
LOOP VESSEL MINI RED (MISCELLANEOUS) IMPLANT
MARKER SPHERE PSV REFLC THRD 3 (MARKER) ×15 IMPLANT
NEEDLE HYPO 25X1 1.5 SAFETY (NEEDLE) ×5 IMPLANT
NEEDLE JAMSHIDI VIPER (NEEDLE) ×5 IMPLANT
NEEDLE SPNL 18GX3.5 QUINCKE PK (NEEDLE) ×5 IMPLANT
PACK FOAM VITOSS 10CC (Orthopedic Implant) ×10 IMPLANT
PACK FOAM VITOSS 5CC (Orthopedic Implant) ×5 IMPLANT
PAD ARMBOARD 7.5X6 YLW CONV (MISCELLANEOUS) ×20 IMPLANT
PATIENT SCAN DRAPE ×5 IMPLANT
ROD RELINE-O 5.5X100 LORD (Rod) ×5 IMPLANT
ROD RELINE-O 5.5X75 LORD (Rod) ×5 IMPLANT
SCREW BONE 4.5X25MM (Screw) ×5 IMPLANT
SCREW LOCK RELINE 5.5 TULIP (Screw) ×30 IMPLANT
SCREW RELINE-O 8.5X60 POLY (Screw) ×5 IMPLANT
SCREW RELINE-O POLY 6.5X45 (Screw) ×25 IMPLANT
SPONGE INTESTINAL PEANUT (DISPOSABLE) ×10 IMPLANT
SPONGE LAP 18X18 X RAY DECT (DISPOSABLE) ×5 IMPLANT
SPONGE LAP 4X18 X RAY DECT (DISPOSABLE) IMPLANT
SPONGE SURGIFOAM ABS GEL SZ50 (HEMOSTASIS) IMPLANT
STAPLER VISISTAT 35W (STAPLE) ×5 IMPLANT
SUT MNCRL AB 4-0 PS2 18 (SUTURE) IMPLANT
SUT PDS AB 1 CTX 36 (SUTURE) ×5 IMPLANT
SUT PROLENE 4 0 RB 1 (SUTURE)
SUT PROLENE 4-0 RB1 .5 CRCL 36 (SUTURE) IMPLANT
SUT PROLENE 5 0 CC1 (SUTURE) IMPLANT
SUT PROLENE 6 0 C 1 30 (SUTURE) IMPLANT
SUT PROLENE 6 0 CC (SUTURE) IMPLANT
SUT SILK 0 TIES 10X30 (SUTURE) IMPLANT
SUT SILK 2 0 TIES 10X30 (SUTURE) IMPLANT
SUT SILK 2 0SH CR/8 30 (SUTURE) IMPLANT
SUT SILK 3 0 TIES 10X30 (SUTURE) IMPLANT
SUT SILK 3 0SH CR/8 30 (SUTURE) IMPLANT
SUT VIC AB 0 CT1 27 (SUTURE)
SUT VIC AB 0 CT1 27XBRD ANBCTR (SUTURE) IMPLANT
SUT VIC AB 2-0 CT1 18 (SUTURE) ×10 IMPLANT
SUT VIC AB 2-0 CT1 36 (SUTURE) IMPLANT
SUT VIC AB 3-0 SH 18 (SUTURE) IMPLANT
SUT VIC AB 3-0 SH 27 (SUTURE)
SUT VIC AB 3-0 SH 27X BRD (SUTURE) IMPLANT
SUT VIC AB 3-0 SH 8-18 (SUTURE) ×15 IMPLANT
SYR CONTROL 10ML LL (SYRINGE) ×5 IMPLANT
TOWEL OR 17X24 6PK STRL BLUE (TOWEL DISPOSABLE) ×10 IMPLANT
TOWEL OR 17X26 10 PK STRL BLUE (TOWEL DISPOSABLE) ×10 IMPLANT

## 2014-02-28 NOTE — Progress Notes (Signed)
ANTIBIOTIC CONSULT NOTE - INITIAL  Pharmacy Consult for vancomycin Indication: surgical prophylaxis  No Known Allergies  Patient Measurements: Height: 5\' 1"  (154.9 cm) Weight: 173 lb 8 oz (78.699 kg) IBW/kg (Calculated) : 47.8   Vital Signs: Temp: 97.5 F (36.4 C) (11/20 1730) Temp Source: Oral (11/20 1730) BP: 129/59 mmHg (11/20 1800) Pulse Rate: 68 (11/20 1800) Intake/Output from previous day:   Intake/Output from this shift: Total I/O In: 4400 [I.V.:4300; Blood:100] Out: 1930 [Urine:1530; Blood:400]  Labs: No results for input(s): WBC, HGB, PLT, LABCREA, CREATININE in the last 72 hours. Estimated Creatinine Clearance: 67.5 mL/min (by C-G formula based on Cr of 0.66). No results for input(s): VANCOTROUGH, VANCOPEAK, VANCORANDOM, GENTTROUGH, GENTPEAK, GENTRANDOM, TOBRATROUGH, TOBRAPEAK, TOBRARND, AMIKACINPEAK, AMIKACINTROU, AMIKACIN in the last 72 hours.   Microbiology: Recent Results (from the past 720 hour(s))  Surgical pcr screen     Status: Abnormal   Collection Time: 02/19/14  4:04 PM  Result Value Ref Range Status   MRSA, PCR POSITIVE (A) NEGATIVE Final   Staphylococcus aureus POSITIVE (A) NEGATIVE Final    Comment:        The Xpert SA Assay (FDA approved for NASAL specimens in patients over 64 years of age), is one component of a comprehensive surveillance program.  Test performance has been validated by [facility] for patients greater than or equal to 64 year old. It is not intended to diagnose infection nor to guide or monitor treatment.    Assessment: [age] YOF s/p posterior lumbar fusion with hardware removal for post-op vanc. No drain in placed. Pre-op vancomycin 1g given at 0921 this morning.  SCr 0.66 with est CrCl ~767mL/min.  Plan:  1. Vancomycin 1000mg  IV x1 tonight at 2100 2. Pharmacy to sign off as no further doses needed. Please re-consult if something arises.   Charlyne Robertshaw D. Kaleyah Labreck, PharmD, BCPS Clinical Pharmacist Pager:  [phone] 02/28/2014 6:13 PM

## 2014-02-28 NOTE — Anesthesia Preprocedure Evaluation (Signed)
Anesthesia Evaluation  Patient identified by MRN, date of birth, ID band Patient awake    Reviewed: Allergy & Precautions, H&P , NPO status , Patient's Chart, lab work & pertinent test results  History of Anesthesia Complications (+) PONV and history of anesthetic complications  Airway Mallampati: IV  TM Distance: >3 FB Neck ROM: Full    Dental  (+) Edentulous Upper, Edentulous Lower   Pulmonary neg shortness of breath, neg sleep apnea, neg COPDneg recent URI, former smoker,  breath sounds clear to auscultation        Cardiovascular hypertension, Pt. on medications - angina- Past MI and - CHF Rhythm:Regular     Neuro/Psych PSYCHIATRIC DISORDERS Anxiety Depression  Neuromuscular disease    GI/Hepatic   Endo/Other  diabetes, Type obesity  Renal/GU      Musculoskeletal  (+) Arthritis -, Fibromyalgia -, narcotic dependent  Abdominal   Peds  Hematology negative hematology ROS (+)   Anesthesia Other Findings   Reproductive/Obstetrics                             Anesthesia Physical Anesthesia Plan  ASA: III  Anesthesia Plan: General   Post-op Pain Management:    Induction: Intravenous  Airway Management Planned: Oral ETT  Additional Equipment: Arterial line  Intra-op Plan:   Post-operative Plan: Extubation in OR and Possible Post-op intubation/ventilation  Informed Consent: I have reviewed the patients History and Physical, chart, labs and discussed the procedure including the risks, benefits and alternatives for the proposed anesthesia with the patient or authorized representative who has indicated his/her understanding and acceptance.   Dental advisory given  Plan Discussed with: CRNA and Surgeon  Anesthesia Plan Comments:         Anesthesia Quick Evaluation

## 2014-02-28 NOTE — Brief Op Note (Signed)
02/28/2014  3:57 PM  PATIENT:  Kristen Snow  63 y.o. 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 Early for approach, revision posterior fusion with pedicle screws L 4, L 5 bilaterally with S 1 left pedicle screw and Right iliac fixation/ with Airo (N/A) with posterolateral arthrodesis    APPLICATION OF INTRAOPERATIVE CT SCAN (N/A) ABDOMINAL EXPOSURE (N/A) - ABDOMINAL EXPOSURE  SURGEON:  Surgeon(s) and Role: Panel 1:    * Geoffry Bannister, MD - Primary    * Henry Elsner, MD - Assisting  Panel 2:    * Todd F Early, MD - Primary  PHYSICIAN ASSISTANT:   ASSISTANTS: Poteat, RN   ANESTHESIA:   general  EBL:  Total I/O In: 4300 [I.V.:4200; Blood:100] Out: 1730 [Urine:1330; Blood:400]  BLOOD ADMINISTERED:100 CC PRBC  DRAINS: none   LOCAL MEDICATIONS USED:  LIDOCAINE   SPECIMEN:  No Specimen  DISPOSITION OF SPECIMEN:  N/A  COUNTS:  YES  TOURNIQUET:  * No tourniquets in log *  DICTATION:   INDICATIONS:  [name] is 63 year old female with chronic and intractable back and bilateral lower extremity pain with marked disc degeneration and spondylosis who had previously undergone decompression and fusion T 10 - L 5 levels.  She had ALIF L 23, L 34, L 45 levels and developed a kyphotic deformity above that fusion.  This was repaired with fixation T 10 - L 5 levels.  The patient then fractured L 5 and developed a kyphotic deformity at L 5 S 1 level with  severe back pain and bilateral lower extremity pain.  It was elected to bring her to surgery for redo ALIF and  repair of L 5 fracture, then posterior fixation with iliac fixation and S 1 fixation.   PROCEDURE:  The patient was placed in a supine on the operating table.  Doctor Early performed exposure and his portion of the procedure will be dictated separately. This was a redo ALIF exposure. A localizing X ray was obtained with the C arm.  I then incised the anterior annulus and performed a thorough discectomy.  The endplates were cleared of disc and cartilagenous material and a thorough discectomy was performed with decompression of the ventral annulus and disc material.  I removed a portion of the L 5 vertebral body and performed a partial corpectomy of the central degenerated portion, but was able to preserve the apophyseal ring for support. I then placed an 8 degree x 38 x 24 mm implant packed with extra small BMP and Vitoss. 5.5 x 25 mm screw was then affixed to the S1 vertebra.  Locking mechanisms were engaged, soft tissues were inspected and found to be in good repair.  Retractors were removed.  Fascia was closed with 1 PDS running stitch, skin edges closed with 2-0 and 3-0 vicryl sutures.  Wound was dressed with a sterile occlusive dressing.  Patient was then turned prone on the OR table for Airo intraoperative CT scanner and, after prepping and draping her low back, the incision was opened and the previously placed hardware was exposed from L 2 - L 5 levels. The rods and connectors were removed from   L 3 , L 4, L 5 levels.  The iliac crest on the left was exposed and pelvis and laminae of S1 were cleared of investing soft tissue.The spinous process clamp and array were placed at L 1 and a CT scan was obtained.   Subsequently, a 6.5 x 45 mm screw was placed at S 1 on the left because the iliac crest had been extensively harvested of bone graft on this side in the past.  An 8.5 x 60 mm iliac screw was placed on the right using navigation. Rods and connectors were attached with new screws at L 4 and L 5 (6.5 x 45 mm  bilaterally) and connectors were re-engaged.  40 cc of allograft chips along with medium BMP and Vitoss reconstituted with marrow rich blood aspirated from the pelvis.   The incision was closed with 1, 2-0, 3-0 vicryl sutures.  A sterile occlusive dressing was placed.   Patient was extubated in the OR and taken to recovery having tolerated his surgery well.  Counts were correct at the end of the case.  PLAN OF CARE: Admit to inpatient   PATIENT DISPOSITION:  PACU - hemodynamically stable.   Delay start of Pharmacological VTE agent (>24hrs) due to surgical blood loss or risk of bleeding: yes

## 2014-02-28 NOTE — Progress Notes (Signed)
Awake, alert, conversant.  Significantly improved pain.  Full strength both legs.  Doing well.

## 2014-02-28 NOTE — Progress Notes (Signed)
eLink Physician-Brief Progress Note Patient Name: Kristen ClarkRita S Aymond DOB: 10/25/1950 MRN: [id number]   Date of Service  02/28/2014  HPI/Events of Note  63 year old female with prior history of multilevel posterior fusion also anterior fusion. Patient admitted for posterior and anterior exposure for L5-S1 disc surgery. She has a PMHx of Dm, HTN, HLD, Afib, GERD, COPD.  Procedures Performed:  Redo L5-S1 Anterior lumbar interbody fusion with Revision of Hardware/possible vertebrectomy with Dr. Arbie CookeyEarly for approach/pedicle screws at L5-S1 with iliac fixation/ with Airo (N/A) APPLICATION OF INTRAOPERATIVE CT SCAN (N/A) ABDOMINAL EXPOSURE (N/A) - ABDOMINAL EXPOSURE POSTERIOR LUMBAR FUSION 1 WITH HARDWARE REMOVAL  eICU Interventions  S/P anterior lumbar fusion - neurosurg following along - pain control  DM - not on any meds per chart review and review of home meds - consider sliding scale  COPD - ok to restart inhalers  HTN - on mulitple meds - restart meds slowly        Namari Breton 02/28/2014, 5:47 PM

## 2014-02-28 NOTE — Op Note (Signed)
    OPERATIVE REPORT  DATE OF SURGERY: 02/28/2014  PATIENT: Kristen Snow, 63 y.o. female MRN: [id number]  DOB: 03/29/1951  PRE-OPERATIVE DIAGNOSIS: Severe degenerative disc disease L5-S1, recurrent  POST-OPERATIVE DIAGNOSIS:  Same  PROCEDURE: Anterior exposure L5-S1  SURGEON:  Gretta Beganodd Early, M.D.  Co-surgeon with Dr. Venetia MaxonStern for the exposure  ANESTHESIA:  Gen.  EBL: 300 ml  Total I/O In: 3000 [I.V.:3000] Out: 1205 [Urine:905; Blood:300]  BLOOD ADMINISTERED: None  DRAINS: None    COUNTS CORRECT:  YES  PLAN OF CARE: PACU   PATIENT DISPOSITION:  PACU - hemodynamically stable  PROCEDURE DETAILS: Patient is a very complex 63 year old female with prior history of multilevel posterior fusion also anterior fusion. She is scheduled for surgery today with both posterior and anterior exposure for L5-S1 disc surgery with Dr. Venetia MaxonStern. I am providing exposure for anterior portion of this.  The abdomen was prepped and draped in usual sterile fashion. A incision was made through the prior left lower quadrant paramedian incision. This was carried down through scar down to the anterior rectus sheath which was opened in line with skin incision. The patient is a very atretic rectus muscle. The rectovaginal space was entered bluntly. There was a great deal scarring from her procedure. The peritoneal sac was left intact and was mobilized to the right. The left ureter was identified last of the right as well as protected. Dissection was continued bluntly down to the L5-S1 disc. There was multiple small venous collaterals that were controlled with cautery and pressure. Middle sacral vein was ligated with ligaclips and divided. Blunt dissection was used to expose the L5-S1 disc with exposure to the right and left of the disc. There was dense scarring from prior surgery. The Thompson retractor was brought onto the field. The 150 reverse lip blade was positioned the right and left of the L5-S1 disc and the  malleable retractors were used for superior and inferior exposure. C-arm was brought back onto the field to confirm that this was L5-S1 disc. The remainder of the procedure will be dictated as a separate note by Dr.Stern   Gretta Beganodd Early, M.D. 02/28/2014 1:41 PM

## 2014-02-28 NOTE — Anesthesia Postprocedure Evaluation (Signed)
  Anesthesia Post-op Note  Patient: Kristen ClarkRita S Kulig  Procedure(s) Performed: Procedure(s) with comments: Redo L5-S1 Anterior lumbar interbody fusion with Revision of Hardware/possible vertebrectomy with Dr. Arbie CookeyEarly for approach/pedicle screws at L5-S1 with iliac fixation/ with Airo (N/A) APPLICATION OF INTRAOPERATIVE CT SCAN (N/A) ABDOMINAL EXPOSURE (N/A) - ABDOMINAL EXPOSURE POSTERIOR LUMBAR FUSION 1 WITH HARDWARE REMOVAL  Patient Location: PACU  Anesthesia Type:General  Level of Consciousness: awake and alert   Airway and Oxygen Therapy: Patient Spontanous Breathing  Post-op Pain: mild  Post-op Assessment: Post-op Vital signs reviewed, Patient's Cardiovascular Status Stable, Respiratory Function Stable, Patent Airway, No signs of Nausea or vomiting and Pain level controlled  Post-op Vital Signs: Reviewed and stable  Last Vitals:  Filed Vitals:   02/28/14 1633  BP: 107/59  Pulse: 68  Temp:   Resp: 12    Complications: No apparent anesthesia complications

## 2014-02-28 NOTE — Transfer of Care (Signed)
Immediate Anesthesia Transfer of Care Note  Patient: Kristen ClarkRita S Salmons  Procedure(s) Performed: Procedure(s) with comments: Redo L5-S1 Anterior lumbar interbody fusion with Revision of Hardware/possible vertebrectomy with Dr. Arbie CookeyEarly for approach/pedicle screws at L5-S1 with iliac fixation/ with Airo (N/A) APPLICATION OF INTRAOPERATIVE CT SCAN (N/A) ABDOMINAL EXPOSURE (N/A) - ABDOMINAL EXPOSURE  Patient Location: PACU  Anesthesia Type:General  Level of Consciousness: awake, alert , oriented, patient cooperative and responds to stimulation  Airway & Oxygen Therapy: Patient Spontanous Breathing and Patient connected to nasal cannula oxygen  Post-op Assessment: Report given to PACU RN, Post -op Vital signs reviewed and stable and Patient moving all extremities X 4  Post vital signs: Reviewed and stable  Complications: No apparent anesthesia complications

## 2014-02-28 NOTE — Op Note (Signed)
02/28/2014  3:57 PM  PATIENT:  Kristen Snow  63 y.o. 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 Arbie CookeyEarly for approach, revision posterior fusion with pedicle screws L 4, L 5 bilaterally with S 1 left pedicle screw and Right iliac fixation/ with Airo (N/A) with posterolateral arthrodesis    APPLICATION OF INTRAOPERATIVE CT SCAN (N/A) ABDOMINAL EXPOSURE (N/A) - ABDOMINAL EXPOSURE  SURGEON:  Surgeon(s) and Role: Panel 1:    * Maeola HarmanJoseph Tawanna Funk, MD - Primary    * Barnett AbuHenry Elsner, MD - Assisting  Panel 2:    * Larina Earthlyodd F Early, MD - Primary  PHYSICIAN ASSISTANT:   ASSISTANTS: Poteat, RN   ANESTHESIA:   general  EBL:  Total I/O In: 4300 [I.V.:4200; Blood:100] Out: 1730 [Urine:1330; Blood:400]  BLOOD ADMINISTERED:100 CC PRBC  DRAINS: none   LOCAL MEDICATIONS USED:  LIDOCAINE   SPECIMEN:  No Specimen  DISPOSITION OF SPECIMEN:  N/A  COUNTS:  YES  TOURNIQUET:  * No tourniquets in log *  DICTATION:   INDICATIONS:  [name] is 63 year old female with chronic and intractable back and bilateral lower extremity pain with marked disc degeneration and spondylosis who had previously undergone decompression and fusion T 10 - L 5 levels.  She had ALIF L 23, L 34, L 45 levels and developed a kyphotic deformity above that fusion.  This was repaired with fixation T 10 - L 5 levels.  The patient then fractured L 5 and developed a kyphotic deformity at L 5 S 1 level with  severe back pain and bilateral lower extremity pain.  It was elected to bring her to surgery for redo ALIF and  repair of L 5 fracture, then posterior fixation with iliac fixation and S 1 fixation.   PROCEDURE:  The patient was placed in a supine on the operating table.  Doctor Early performed exposure and his portion of the procedure will be dictated separately. This was a redo ALIF exposure. A localizing X ray was obtained with the C arm.  I then incised the anterior annulus and performed a thorough discectomy.  The endplates were cleared of disc and cartilagenous material and a thorough discectomy was performed with decompression of the ventral annulus and disc material.  I removed a portion of the L 5 vertebral body and performed a partial corpectomy of the central degenerated portion, but was able to preserve the apophyseal ring for support. I then placed an 8 degree x 38 x 24 mm implant packed with extra small BMP and Vitoss. 5.5 x 25 mm screw was then affixed to the S1 vertebra.  Locking mechanisms were engaged, soft tissues were inspected and found to be in good repair.  Retractors were removed.  Fascia was closed with 1 PDS running stitch, skin edges closed with 2-0 and 3-0 vicryl sutures.  Wound was dressed with a sterile occlusive dressing.  Patient was then turned prone on the OR table for Airo intraoperative CT scanner and, after prepping and draping her low back, the incision was opened and the previously placed hardware was exposed from L 2 - L 5 levels. The rods and connectors were removed from  L 3 , L 4, L 5 levels.  The iliac crest on the left was exposed and pelvis and laminae of S1 were cleared of investing soft tissue.The spinous process clamp and array were placed at L 1 and a CT scan was obtained.   Subsequently, a 6.5 x 45 mm screw was placed at S 1 on the left because the iliac crest had been extensively harvested of bone graft on this side in the past.  An 8.5 x 60 mm iliac screw was placed on the right using navigation. Rods and connectors were attached with new screws at L 4 and L 5 (6.5 x 45 mm  bilaterally) and connectors were re-engaged.  40 cc of allograft chips along with medium BMP and Vitoss reconstituted with marrow rich blood aspirated from the pelvis.   The incision was closed with 1, 2-0, 3-0 vicryl sutures.  A sterile occlusive dressing was placed.   Patient was extubated in the OR and taken to recovery having tolerated his surgery well.  Counts were correct at the end of the case.  PLAN OF CARE: Admit to inpatient   PATIENT DISPOSITION:  PACU - hemodynamically stable.   Delay start of Pharmacological VTE agent (>24hrs) due to surgical blood loss or risk of bleeding: yes

## 2014-02-28 NOTE — Progress Notes (Signed)
PHARMACIST - PHYSICIAN COMMUNICATION  CONCERNING: P&T Medication Policy Regarding Oral Bisphosphonates  RECOMMENDATION: Your order for alendronate (Fosamax), ibandronate (Boniva), or risedronate (Actonel) has been discontinued at this time.  If the patient's post-hospital medical condition warrants safe use of this class of drugs, please resume the pre-hospital regimen upon discharge.  DESCRIPTION:  Alendronate (Fosamax), ibandronate (Boniva), and risedronate (Actonel) can cause severe esophageal erosions in patients who are unable to remain upright at least 30 minutes after taking this medication.   Since brief interruptions in therapy are thought to have minimal impact on bone mineral density, the Pharmacy & Therapeutics Committee has established that bisphosphonate orders should be routinely discontinued during hospitalization.   To override this safety policy and permit administration of Boniva, Fosamax, or Actonel in the hospital, prescribers must write "DO NOT HOLD" in the comments section when placing the order for this class of medications.   Kristen Snow, PharmD Clinical Pharmacist Resident Pager [phone] 02/28/2014 6:31 PM

## 2014-03-01 LAB — BASIC METABOLIC PANEL
Anion gap: 11 (ref 5–15)
BUN: 10 mg/dL (ref 6–23)
CHLORIDE: 101 meq/L (ref 96–112)
CO2: 25 mEq/L (ref 19–32)
Calcium: 8.7 mg/dL (ref 8.4–10.5)
Creatinine, Ser: 0.81 mg/dL (ref 0.50–1.10)
GFR calc Af Amer: 87 mL/min — ABNORMAL LOW (ref >90)
GFR calc non Af Amer: 75 mL/min — ABNORMAL LOW (ref >90)
Glucose, Bld: 143 mg/dL — ABNORMAL HIGH (ref 70–99)
POTASSIUM: 4 meq/L (ref 3.7–5.3)
Sodium: 137 mEq/L (ref 137–147)

## 2014-03-01 LAB — CBC
HEMATOCRIT: 27.9 % — AB (ref 36.0–46.0)
Hemoglobin: 8.8 g/dL — ABNORMAL LOW (ref 12.0–15.0)
MCH: 30.3 pg (ref 26.0–34.0)
MCHC: 31.5 g/dL (ref 30.0–36.0)
MCV: 96.2 fL (ref 78.0–100.0)
Platelets: 220 10*3/uL (ref 150–400)
RBC: 2.9 MIL/uL — ABNORMAL LOW (ref 3.87–5.11)
RDW: 14.8 % (ref 11.5–15.5)
WBC: 10.7 10*3/uL — AB (ref 4.0–10.5)

## 2014-03-01 LAB — GLUCOSE, CAPILLARY
GLUCOSE-CAPILLARY: 113 mg/dL — AB (ref 70–99)
GLUCOSE-CAPILLARY: 203 mg/dL — AB (ref 70–99)
GLUCOSE-CAPILLARY: 172 mg/dL — AB (ref 70–99)
GLUCOSE-CAPILLARY: 179 mg/dL — AB (ref 70–99)
Glucose-Capillary: 173 mg/dL — ABNORMAL HIGH (ref 70–99)
Glucose-Capillary: 237 mg/dL — ABNORMAL HIGH (ref 70–99)

## 2014-03-01 MED ORDER — CETYLPYRIDINIUM CHLORIDE 0.05 % MT LIQD
7.0000 mL | Freq: Two times a day (BID) | OROMUCOSAL | Status: DC
  Administered 2014-03-01 – 2014-03-05 (×10): 7 mL via OROMUCOSAL

## 2014-03-01 MED ORDER — INSULIN ASPART 100 UNIT/ML ~~LOC~~ SOLN
0.0000 [IU] | Freq: Three times a day (TID) | SUBCUTANEOUS | Status: DC
  Administered 2014-03-01: 3 [IU] via SUBCUTANEOUS
  Administered 2014-03-02: 2 [IU] via SUBCUTANEOUS
  Administered 2014-03-02: 3 [IU] via SUBCUTANEOUS
  Administered 2014-03-02: 2 [IU] via SUBCUTANEOUS
  Administered 2014-03-03: 3 [IU] via SUBCUTANEOUS
  Administered 2014-03-03 – 2014-03-04 (×2): 5 [IU] via SUBCUTANEOUS
  Administered 2014-03-04: 3 [IU] via SUBCUTANEOUS

## 2014-03-01 MED ORDER — PNEUMOCOCCAL VAC POLYVALENT 25 MCG/0.5ML IJ INJ
0.5000 mL | INJECTION | INTRAMUSCULAR | Status: AC
  Administered 2014-03-02: 0.5 mL via INTRAMUSCULAR
  Filled 2014-03-01: qty 0.5

## 2014-03-01 MED ORDER — INSULIN ASPART 100 UNIT/ML ~~LOC~~ SOLN: 0.0000 [IU] | Freq: Three times a day (TID) | SUBCUTANEOUS | Status: DC

## 2014-03-01 MED ORDER — MUPIROCIN 2 % EX OINT
1.0000 "application " | TOPICAL_OINTMENT | Freq: Two times a day (BID) | CUTANEOUS | Status: AC
  Administered 2014-03-01 – 2014-03-05 (×10): 1 via NASAL
  Filled 2014-03-01 (×2): qty 22

## 2014-03-01 MED ORDER — CHLORHEXIDINE GLUCONATE CLOTH 2 % EX PADS
6.0000 | MEDICATED_PAD | Freq: Every day | CUTANEOUS | Status: AC
  Administered 2014-03-01 – 2014-03-05 (×5): 6 via TOPICAL

## 2014-03-01 NOTE — Plan of Care (Signed)
Problem: Phase I Progression Outcomes Goal: Pain controlled with appropriate interventions Outcome: Completed/Met Date Met:  03/01/14     

## 2014-03-01 NOTE — Progress Notes (Signed)
Patient ID: Kristen Snow, female   DOB: 05/18/1950, 63 y.o.   MRN: [id number] Subjective:  The patient is alert and pleasant. She looks well. She is in no apparent distress.  Objective: Vital signs in last 24 hours: Temp:  [97.4 F (36.3 C)-100.2 F (37.9 C)] 100.2 F (37.9 C) (11/21 0359) Pulse Rate:  [66-91] 76 (11/21 0700) Resp:  [12-22] 15 (11/21 0700) BP: (81-156)/(41-81) 95/43 mmHg (11/21 0700) SpO2:  [93 %-100 %] 100 % (11/21 0700) Arterial Line BP: (88-165)/(42-66) 99/52 mmHg (11/21 0700)  Intake/Output from previous day: 11/20 0701 - 11/21 0700 In: 5303 [I.V.:5203; Blood:100] Out: 3860 [Urine:3460; Blood:400] Intake/Output this shift:    Physical exam the patient is alert and oriented 3. Her strength is grossly normal bila gastrocnemius and dorsiflexors. Her abdomen is soft.  Lab Results: No results for input(s): WBC, HGB, HCT, PLT in the last 72 hours. BMET No results for input(s): NA, K, CL, CO2, GLUCOSE, BUN, CREATININE, CALCIUM in the last 72 hours.  Studies/Results: Dg Lumbar Spine 2-3 Views  02/28/2014   CLINICAL DATA:  Anterior lumbar interbody fusion at L5-S1  EXAM: DG C-ARM 61-120 MIN; LUMBAR SPINE - 2-3 VIEW  FLUOROSCOPY TIME:  24 seconds  COMPARISON:  01/14/2014 CT lumbar spine  FINDINGS: Four intraoperative fluoroscopic spot images from lumbar surgery provided. Posterior fusion hardware is noted again. There is a cannulated screw in the S1 vertebral body.  IMPRESSION: Intraoperative fluoroscopic guidance.   Electronically Signed   By: Elige KoHetal  Patel   On: 02/28/2014 12:59   Dg C-arm 1-60 Min-no Report  02/28/2014   CLINICAL DATA: Lumbar fusion with Airo   C-ARM 1-60 MINUTES  Fluoroscopy was utilized by the requesting physician.  No radiographic  interpretation.    Dg C-arm 61-120 Min  02/28/2014   CLINICAL DATA:  Anterior lumbar interbody fusion at L5-S1  EXAM: DG C-ARM 61-120 MIN; LUMBAR SPINE - 2-3 VIEW  FLUOROSCOPY TIME:  24 seconds  COMPARISON:   01/14/2014 CT lumbar spine  FINDINGS: Four intraoperative fluoroscopic spot images from lumbar surgery provided. Posterior fusion hardware is noted again. There is a cannulated screw in the S1 vertebral body.  IMPRESSION: Intraoperative fluoroscopic guidance.   Electronically Signed   By: Elige KoHetal  Patel   On: 02/28/2014 12:59   Dg Or Local Abdomen  02/28/2014   CLINICAL DATA:  Initial encounter for incorrect instrument count  EXAM: OR LOCAL ABDOMEN  COMPARISON:  12/30/2013.  FINDINGS: 1207 hrs. Patient is status post lumbar fusion, as before. Evidence of interval anterior lumbar interbody fusion at L5-S1. No unexpected radiopaque foreign body.  IMPRESSION: No unexpected radiopaque foreign body.   Electronically Signed   By: Kennith CenterEric  Mansell M.Snow.   On: 02/28/2014 12:42    Assessment/Plan: The patient is doing well.  LOS: 1 day     Kristen Snow 03/01/2014, 8:29 AM

## 2014-03-01 NOTE — Plan of Care (Signed)
Problem: Consults Goal: Diabetes Guidelines if Diabetic/Glucose > 140 If diabetic or lab glucose is > 140 mg/dl - Initiate Diabetes/Hyperglycemia Guidelines & Document Interventions  Outcome: Completed/Met Date Met:  03/01/14

## 2014-03-01 NOTE — Progress Notes (Signed)
   VASCULAR SURGERY ASSESSMENT & PLAN:  * 1 Day Post-Op s/p: Anterior retroperitoneal exposure of L5-S1.  *  Doing well. Vascular surgery will be available as needed.  SUBJECTIVE: pain adequately controlled.  PHYSICAL EXAM: Filed Vitals:   03/01/14 0400 03/01/14 0500 03/01/14 0600 03/01/14 0700  BP: 92/45 88/48 101/48 95/43  Pulse: 74 72 74 76  Temp:      TempSrc:      Resp: 20 14 16 15   Height:      Weight:      SpO2: 100% 97% 100% 100%   Both feet are warm and well-perfused.  LABS: Lab Results  Component Value Date   WBC 8.4 02/19/2014   HGB 11.8* 02/19/2014   HCT 36.9 02/19/2014   MCV 96.9 02/19/2014   PLT 205 02/19/2014   CBG (last 3)   Recent Labs  02/28/14 0610 02/28/14 1713 02/28/14 1802  GLUCAP 92 104* 115*    Active Problems:   Acquired spinal deformity   Cari CarawayChris Dickson Beeper: [phone] 03/01/2014

## 2014-03-01 NOTE — Progress Notes (Signed)
Pt BP dropped to 88/42 (53) HR 86. Assessed patient, recycled BP on both arms, still low, HR 80s, Pt neuro intact, UO 1025 so far this shift. Called Dr. Lovell SheehanJenkins. No interventions at this time. Will continue to monitor and update as needed.

## 2014-03-01 NOTE — Progress Notes (Signed)
PT Cancellation Note  Patient Details Name: Krystal ClarkRita S Banning MRN: [id number] DOB: 05/20/1950   Cancelled Treatment:    Reason Eval/Treat Not Completed: Patient not medically ready.  Bedrest order until POD #2 (11/22).  Will f/u tomorrow.     Mary-Ann Pennella, Alison MurrayMegan F 03/01/2014, 8:00 AM

## 2014-03-01 NOTE — Plan of Care (Signed)
Problem: Phase I Progression Outcomes Goal: Log roll for position change Outcome: Not Applicable Date Met:  74/73/40

## 2014-03-01 NOTE — Plan of Care (Signed)
Problem: Phase I Progression Outcomes Goal: OOB as tolerated unless otherwise ordered Outcome: Not Applicable Date Met:  46/65/99

## 2014-03-02 DIAGNOSIS — M8088XA Other osteoporosis with current pathological fracture, vertebra(e), initial encounter for fracture: Secondary | ICD-10-CM | POA: Diagnosis not present

## 2014-03-02 LAB — GLUCOSE, CAPILLARY
GLUCOSE-CAPILLARY: 139 mg/dL — AB (ref 70–99)
GLUCOSE-CAPILLARY: 183 mg/dL — AB (ref 70–99)
Glucose-Capillary: 121 mg/dL — ABNORMAL HIGH (ref 70–99)
Glucose-Capillary: 151 mg/dL — ABNORMAL HIGH (ref 70–99)

## 2014-03-02 NOTE — Progress Notes (Signed)
Utilization Review Completed.Kristen Snow T11/22/2015  

## 2014-03-02 NOTE — Evaluation (Addendum)
Occupational Therapy Evaluation Patient Details Name: Kristen ClarkRita S Snow MRN: [id number] DOB: 09/28/1950 Today's Date: 03/02/2014    History of Present Illness 63 y.o. s/p Redo L5-S1 Anterior lumbar interbody fusion with partial corpectomy with PEEK cage, allograft, vertebral screw with Dr. Arbie CookeyEarly for approach, revision posterior fusion with pedicle screws L 4, L 5 bilaterally with S 1 left pedicle screw and Right iliac fixation/ with Airo (N/A) with posterolateral arthrodesis .   Clinical Impression   [name]. [name], PTA. Feel pt will benefit from acute OT to increase strength and independence with BADLs, PTA. Session limited due to dizziness. [name] to stay at daughter's house.    Follow Up Recommendations  Home health OT;Supervision/Assistance - 24 hour    Equipment Recommendations  Other (comment) (AE)    Recommendations for Other Services       Precautions / Restrictions Precautions Precautions: Back;Fall Precaution Booklet Issued: Yes (comment) Precaution Comments: educated on back precautions Required Braces or Orthoses: Spinal Brace Spinal Brace: Lumbar corset;Applied in sitting position Restrictions Weight Bearing Restrictions: No      Mobility Bed Mobility Overal bed mobility: Needs Assistance Bed Mobility: Rolling;Sidelying to Sit;Sit to Sidelying Rolling: Mod assist;Min assist Sidelying to sit: Max assist     Sit to sidelying: Mod assist General bed mobility comments: assist with legs to return to sidelying. Assist with hips and trunk to sit EOB.  Cues for log roll technique.  Transfers Overall transfer level: Needs assistance Equipment used: Rolling walker (2 wheeled) Transfers: Sit to/from Stand Sit to Stand: Mod assist         General transfer comment: assist to boost.     Balance                                            ADL Overall ADL's : Needs assistance/impaired                  Upper Body Dressing : Moderate assistance;Sitting   Lower Body Dressing: Maximal assistance;Sit to/from stand   Toilet Transfer: Moderate assistance;RW (sit to stand from bed)           Functional mobility during ADLs:  (did not ambulate) General ADL Comments: Educated on AE for LB ADLs as well as back brace information. Handout provided and additional information written down. Educated on use of cup for oral care and placement of grooming items to avoid breaking precautions. Educated on Psychologist, prison and probation servicessafety tips. Pt became dizzy and pale when standing so returned to bed. Educated to perform ankle pumps.     Vision                     Perception     Praxis      Pertinent Vitals/Pain Pain Assessment: 0-10 Pain Score: 7  Pain Location: back Pain Intervention(s): Repositioned;Monitored during session     Hand Dominance Right   Extremity/Trunk Assessment Upper Extremity Assessment Upper Extremity Assessment: Overall WFL for tasks assessed   Lower Extremity Assessment Lower Extremity Assessment: Defer to PT evaluation       Communication Communication Communication: No difficulties   Cognition Arousal/Alertness: Awake/alert Behavior During Therapy: Anxious Overall Cognitive Status: No family/caregiver present to determine baseline cognitive functioning (slow processing; answering questions differently; decreased short term memory)  General Comments       Exercises Exercises: Other exercises Other Exercises Other Exercises: ankle pumps   Shoulder Instructions      Home Living Family/patient expects to be discharged to:: Private residence Living Arrangements: Children Available Help at Discharge: Family;Available 24 hours/day Type of Home: House Home Access: Stairs to enter Entergy CorporationEntrance Stairs-Number of Steps: 8  (8 steps at daughter's house; 1 step at pt's house) Entrance Stairs-Rails: Right;Left;Can reach both (at daughter's house) Home  Layout: One level (daughter's is one level; pt's is multilevel)     Bathroom Shower/Tub: Tub/shower unit         Home Equipment: Environmental consultantWalker - 2 wheels;Walker - 4 wheels;Bedside commode;Shower seat;Adaptive equipment Adaptive Equipment: Reacher        Prior Functioning/Environment Level of Independence: Needs assistance  Gait / Transfers Assistance Needed: using rollator ADL's / Homemaking Assistance Needed: assist with LB dressing as well as with bathing; assist with cooking        OT Diagnosis: Generalized weakness;Acute pain   OT Problem List: Decreased strength;Decreased cognition;Decreased knowledge of use of DME or AE;Decreased knowledge of precautions;Pain;Decreased range of motion;Decreased activity tolerance   OT Treatment/Interventions: Self-care/ADL training;DME and/or AE instruction;Cognitive remediation/compensation;Patient/family education;Balance training    OT Goals(Current goals can be found in the care plan section) Acute Rehab OT Goals Patient Stated Goal: not stated OT Goal Formulation: With patient Time For Goal Achievement: 03/09/14 Potential to Achieve Goals: Good ADL Goals Pt Will Perform Lower Body Dressing: with adaptive equipment;with min guard assist;sit to/from stand Pt Will Transfer to Toilet: with min guard assist;ambulating;bedside commode Pt Will Perform Toileting - Clothing Manipulation and hygiene: with min guard assist;sit to/from stand Additional ADL Goal #1: Pt will independently verbalize and demonstrate 3/3 back precautions. Additional ADL Goal #2: Pt will independently donn/doff back brace.  OT Frequency: Min 2X/week   Barriers to D/C:            Co-evaluation              End of Session Equipment Utilized During Treatment: Gait belt;Rolling walker;Back brace Nurse Communication: Mobility status (cognition)  Activity Tolerance: Other (comment) (dizziness/lightheaded) Patient left: in bed;with call bell/phone within  reach;with bed alarm set   Time: 1637-1710 OT Time Calculation (min): 33 min Charges:  OT General Charges $OT Visit: 1 Procedure OT Evaluation $Initial OT Evaluation Tier I: 1 Procedure OT Treatments $Self Care/Home Management : 8-22 mins G-CodesEarlie Raveling:    Cerria Randhawa L OTR/L [phone] 03/02/2014, 6:14 PM

## 2014-03-02 NOTE — Progress Notes (Signed)
PT Cancellation Note  Patient Details Name: Kristen ClarkRita S Snow MRN: [id number] DOB: 02/23/1951   Cancelled Treatment:    Reason Eval/Treat Not Completed: Patient not medically ready.  Neuro plans to elevate HOB to 45degrees today. Will hold on OOB activities till tomorrow. Awaiting Dr. Venetia MaxonStern in regards to need for brace.    Donnamarie PoagWest, Vena Bassinger HollisterN, [place] CarolinaPT  [phone] 03/02/2014, 9:10 AM

## 2014-03-02 NOTE — Progress Notes (Signed)
OT Cancellation Note  Patient Details Name: Kristen ClarkRita S Snow MRN: [id number] DOB: 04/18/1950   Cancelled Treatment:    Reason Eval/Treat Not Completed: Patient not medically ready. Pt on bedrest and HOB being elevated to 45 degrees today.  Earlie RavelingStraub, Nadeem Romanoski L OTR/L [phone] 03/02/2014, 12:06 PM

## 2014-03-02 NOTE — Plan of Care (Signed)
Problem: Phase II Progression Outcomes Goal: Tolerating diet Outcome: Completed/Met Date Met:  03/02/14

## 2014-03-02 NOTE — Progress Notes (Signed)
Patient ID: Kristen Snow, female   DOB: 11/16/1950, 63 y.o.   MRN: [id number] Subjective:  The patient is alert and pleasant. She is in no apparent distress. She is apprehensive about getting out of bed.  Objective: Vital signs in last 24 hours: Temp:  [98.1 F (36.7 C)-100.8 F (38.2 C)] 100.8 F (38.2 C) (11/22 0800) Pulse Rate:  [84-98] 88 (11/22 0700) Resp:  [14-26] 21 (11/22 0700) BP: (88-131)/(27-84) 96/48 mmHg (11/22 0700) SpO2:  [92 %-100 %] 98 % (11/22 0700) Arterial Line BP: (74)/(39) 74/39 mmHg (11/21 1000)  Intake/Output from previous day: 11/21 0701 - 11/22 0700 In: 2811 [P.O.:1080; I.V.:1731] Out: 4175 [Urine:4175] Intake/Output this shift:    Physical exam the patient is alert and oriented. She is moving her lower extremities well.  Lab Results:  Recent Labs  03/01/14 1240  WBC 10.7*  HGB 8.8*  HCT 27.9*  PLT 220   BMET  Recent Labs  03/01/14 1240  NA 137  K 4.0  CL 101  CO2 25  GLUCOSE 143*  BUN 10  CREATININE 0.81  CALCIUM 8.7    Studies/Results: Dg Lumbar Spine 2-3 Views  02/28/2014   CLINICAL DATA:  Anterior lumbar interbody fusion at L5-S1  EXAM: DG C-ARM 61-120 MIN; LUMBAR SPINE - 2-3 VIEW  FLUOROSCOPY TIME:  24 seconds  COMPARISON:  01/14/2014 CT lumbar spine  FINDINGS: Four intraoperative fluoroscopic spot images from lumbar surgery provided. Posterior fusion hardware is noted again. There is a cannulated screw in the S1 vertebral body.  IMPRESSION: Intraoperative fluoroscopic guidance.   Electronically Signed   By: Elige KoHetal  Patel   On: 02/28/2014 12:59   Dg C-arm 1-60 Min-no Report  02/28/2014   CLINICAL DATA: Lumbar fusion with Airo   C-ARM 1-60 MINUTES  Fluoroscopy was utilized by the requesting physician.  No radiographic  interpretation.    Dg C-arm 61-120 Min  02/28/2014   CLINICAL DATA:  Anterior lumbar interbody fusion at L5-S1  EXAM: DG C-ARM 61-120 MIN; LUMBAR SPINE - 2-3 VIEW  FLUOROSCOPY TIME:  24 seconds  COMPARISON:   01/14/2014 CT lumbar spine  FINDINGS: Four intraoperative fluoroscopic spot images from lumbar surgery provided. Posterior fusion hardware is noted again. There is a cannulated screw in the S1 vertebral body.  IMPRESSION: Intraoperative fluoroscopic guidance.   Electronically Signed   By: Elige KoHetal  Patel   On: 02/28/2014 12:59   Dg Or Local Abdomen  02/28/2014   CLINICAL DATA:  Initial encounter for incorrect instrument count  EXAM: OR LOCAL ABDOMEN  COMPARISON:  12/30/2013.  FINDINGS: 1207 hrs. Patient is status post lumbar fusion, as before. Evidence of interval anterior lumbar interbody fusion at L5-S1. No unexpected radiopaque foreign body.  IMPRESSION: No unexpected radiopaque foreign body.   Electronically Signed   By: Kennith CenterEric  Mansell M.D.   On: 02/28/2014 12:42    Assessment/Plan: Postop day #2: I will transfer the patient to 4 N. We will elevate her head of bed to 45. The patient does not have an orthosis ordered. I don't know what kind of orthosis Dr. Venetia MaxonStern wants so we will keep her in bed until tomorrow.  LOS: 2 days     Courtnie Brenes D 03/02/2014, 9:00 AM

## 2014-03-02 NOTE — Plan of Care (Signed)
Problem: Phase I Progression Outcomes Goal: Hemodynamically stable Outcome: Progressing     

## 2014-03-02 NOTE — Plan of Care (Signed)
Problem: Phase I Progression Outcomes Goal: PT/OT consults requested Outcome: Completed/Met Date Met:  03/02/14

## 2014-03-02 NOTE — Plan of Care (Signed)
Problem: Phase I Progression Outcomes Goal: Hemodynamically stable Outcome: Completed/Met Date Met:  03/02/14     

## 2014-03-03 LAB — BASIC METABOLIC PANEL
ANION GAP: 11 (ref 5–15)
BUN: 14 mg/dL (ref 6–23)
CALCIUM: 8.3 mg/dL — AB (ref 8.4–10.5)
CHLORIDE: 100 meq/L (ref 96–112)
CO2: 22 mEq/L (ref 19–32)
Creatinine, Ser: 0.82 mg/dL (ref 0.50–1.10)
GFR calc Af Amer: 86 mL/min — ABNORMAL LOW (ref >90)
GFR calc non Af Amer: 74 mL/min — ABNORMAL LOW (ref >90)
GLUCOSE: 93 mg/dL (ref 70–99)
POTASSIUM: 3.9 meq/L (ref 3.7–5.3)
SODIUM: 133 meq/L — AB (ref 137–147)

## 2014-03-03 LAB — CBC WITH DIFFERENTIAL/PLATELET
Basophils Absolute: 0.1 10*3/uL (ref 0.0–0.1)
Basophils Relative: 1 % (ref 0–1)
EOS PCT: 3 % (ref 0–5)
Eosinophils Absolute: 0.4 10*3/uL (ref 0.0–0.7)
HCT: 27 % — ABNORMAL LOW (ref 36.0–46.0)
Hemoglobin: 8.4 g/dL — ABNORMAL LOW (ref 12.0–15.0)
LYMPHS ABS: 2 10*3/uL (ref 0.7–4.0)
LYMPHS PCT: 15 % (ref 12–46)
MCH: 30 pg (ref 26.0–34.0)
MCHC: 31.1 g/dL (ref 30.0–36.0)
MCV: 96.4 fL (ref 78.0–100.0)
Monocytes Absolute: 1.7 10*3/uL — ABNORMAL HIGH (ref 0.1–1.0)
Monocytes Relative: 13 % — ABNORMAL HIGH (ref 3–12)
NEUTROS ABS: 9.3 10*3/uL — AB (ref 1.7–7.7)
NEUTROS PCT: 68 % (ref 43–77)
PLATELETS: 234 10*3/uL (ref 150–400)
RBC: 2.8 MIL/uL — AB (ref 3.87–5.11)
RDW: 14 % (ref 11.5–15.5)
WBC: 13.5 10*3/uL — AB (ref 4.0–10.5)

## 2014-03-03 LAB — GLUCOSE, CAPILLARY
GLUCOSE-CAPILLARY: 122 mg/dL — AB (ref 70–99)
Glucose-Capillary: 245 mg/dL — ABNORMAL HIGH (ref 70–99)
Glucose-Capillary: 205 mg/dL — ABNORMAL HIGH (ref 70–99)
Glucose-Capillary: 151 mg/dL — ABNORMAL HIGH (ref 70–99)
Glucose-Capillary: 120 mg/dL — ABNORMAL HIGH (ref 70–99)

## 2014-03-03 MED ORDER — ENOXAPARIN SODIUM 40 MG/0.4ML ~~LOC~~ SOLN
40.0000 mg | SUBCUTANEOUS | Status: DC
  Administered 2014-03-03 – 2014-03-05 (×3): 40 mg via SUBCUTANEOUS
  Filled 2014-03-03 (×3): qty 0.4

## 2014-03-03 MED ORDER — POLYETHYLENE GLYCOL 3350 17 G PO PACK
17.0000 g | PACK | Freq: Every day | ORAL | Status: DC
  Administered 2014-03-03 – 2014-03-05 (×3): 17 g via ORAL
  Filled 2014-03-03 (×4): qty 1

## 2014-03-03 MED ORDER — KCL IN DEXTROSE-NACL 20-5-0.45 MEQ/L-%-% IV SOLN
INTRAVENOUS | Status: DC
  Administered 2014-03-03 – 2014-03-04 (×2): via INTRAVENOUS
  Filled 2014-03-03 (×3): qty 1000

## 2014-03-03 MED ORDER — SODIUM CHLORIDE 0.9 % IV BOLUS (SEPSIS)
500.0000 mL | Freq: Once | INTRAVENOUS | Status: AC
  Administered 2014-03-03: 500 mL via INTRAVENOUS

## 2014-03-03 MED ORDER — SODIUM CHLORIDE 0.9 % IV BOLUS (SEPSIS)
500.0000 mL | Freq: Once | INTRAVENOUS | Status: AC
Start: 2014-03-03 — End: 2014-03-03
  Administered 2014-03-03: 500 mL via INTRAVENOUS

## 2014-03-03 MED FILL — Heparin Sodium (Porcine) Inj 1000 Unit/ML: INTRAMUSCULAR | Qty: 30 | Status: AC

## 2014-03-03 MED FILL — Sodium Chloride IV Soln 0.9%: INTRAVENOUS | Qty: 1000 | Status: AC

## 2014-03-03 MED FILL — Sodium Chloride Irrigation Soln 0.9%: Qty: 3000 | Status: AC

## 2014-03-03 NOTE — Clinical Social Work Note (Signed)
CSW Consult Acknowledged:   CSW received consult for SNF placement. PT/OT will evaulate pt's needs thereafter CSW will follow up if needed.   Takya Vandivier, MSW, LCSWA [phone]  

## 2014-03-03 NOTE — Progress Notes (Signed)
Subjective: Patient reports "I got dizzy when I stood up this morning"  Objective: Vital signs in last 24 hours: Temp:  [98.5 F (36.9 C)-103.1 F (39.5 C)] 98.7 F (37.1 C) (11/23 0658) Pulse Rate:  [85-109] 90 (11/23 0658) Resp:  [16-20] 18 (11/23 0658) BP: (83-122)/(36-55) 102/38 mmHg (11/23 0658) SpO2:  [94 %-98 %] 97 % (11/23 0658)  Intake/Output from previous day: 11/22 0701 - 11/23 0700 In: 540 [P.O.:240; I.V.:300] Out: 1750 [Urine:1750] Intake/Output this shift:    Alert, conversant, reporting back pain "was 7 out of 10", but currently able to turn in bed without evidence of pain.  Good strength BLE. Incisions without erythema, swelling, or drainage. Honeycomb drsgs intact. Belly soft, nondistended. Passing gas, but no BM yet. She apparently has only been OOB to stand, with some dizziness. Temp spiked yesterday after bedrest for two days post-op. Now afebrile & using incentive spirometer faithfully(per pt). BP's remain around 100 or lower systolic. She reports her appetite is ok.  She now has an LSO.   Lab Results:  Recent Labs  03/01/14 1240  WBC 10.7*  HGB 8.8*  HCT 27.9*  PLT 220   BMET  Recent Labs  03/01/14 1240  NA 137  K 4.0  CL 101  CO2 25  GLUCOSE 143*  BUN 10  CREATININE 0.81  CALCIUM 8.7    Studies/Results: No results found.  Assessment/Plan: Improved pain level   LOS: 3 days  Per DrStern, will give 500ml NS bolus for dizziness and mildly low BP's. Mobilize in LSO with PT, donning brace when supine in bed.    Kristen Snow, Kristen Snow 03/03/2014, 8:41 AM

## 2014-03-03 NOTE — Progress Notes (Addendum)
PT Cancellation Note  Patient Details Name: Krystal ClarkRita S Eisen MRN: [id number] DOB: 10/21/1950   Cancelled Treatment:    Reason Eval/Treat Not Completed: Medical issues which prohibited therapy. After 1st 500 cc bolus of saline BP is still 80's/30s per RN. She is to give another bolus and will see pt for PT eval if BP is improved this pm   Cornell Gaber 03/03/2014, 11:16 AM  Pager [phone]  1411-- BP taken via dynamap (at pt's bedside) with initial BP Lt arm 77/22; repeated with BP 87/24. Drenda FreezeFran, RN notified.  03/03/2014 Veda CanningLynn Ossiel Marchio, PT Pager: [phone]

## 2014-03-03 NOTE — Progress Notes (Signed)
Went in to check on patient and temp was 103.1 and patient was arousable but very drowsy, able to respond to verbal commands, and knows where she is, and why she's here.  Pt's blood pressure was 101/37 with pulse 99.  Called the Dr. And gave SBAR report on patient.  Dr. Ilean SkillEllsner asked if tylenol had been give, it had; he wants to continue monitoring the patient overnight.

## 2014-03-03 NOTE — Plan of Care (Signed)
Problem: Phase III Progression Outcomes Goal: Pain controlled on oral analgesia Outcome: Progressing     

## 2014-03-03 NOTE — Progress Notes (Signed)
Pt is awake, alert, and oriented times 4.  Asked pt if she was still feeling drowsy, and pt stated, "I was just sleepy."  Pt. Appears to be doing well and v/s are back at her baseline.

## 2014-03-04 LAB — GLUCOSE, CAPILLARY
GLUCOSE-CAPILLARY: 224 mg/dL — AB (ref 70–99)
Glucose-Capillary: 101 mg/dL — ABNORMAL HIGH (ref 70–99)
Glucose-Capillary: 178 mg/dL — ABNORMAL HIGH (ref 70–99)
Glucose-Capillary: 247 mg/dL — ABNORMAL HIGH (ref 70–99)

## 2014-03-04 NOTE — Progress Notes (Signed)
Subjective: Patient reports "I walked in the room yesterday. i only get dizzy now when I turn over in bed"  Objective: Vital signs in last 24 hours: Temp:  [97.5 F (36.4 C)-99 F (37.2 C)] 98.3 F (36.8 C) (11/24 1020) Pulse Rate:  [77-107] 107 (11/24 1020) Resp:  [16-18] 18 (11/24 1020) BP: (87-127)/(24-69) 106/69 mmHg (11/24 1020) SpO2:  [95 %-100 %] 98 % (11/24 1020)  Intake/Output from previous day: 11/23 0701 - 11/24 0700 In: 120 [P.O.:120] Out: 400 [Urine:400] Intake/Output this shift: Total I/O In: 120 [P.O.:120] Out: -   Alert, conversant, reporting no pain at present. Good strength BLE. Belly soft. Pt reports BM yesterday. BP's improving, taken manually, with IVF at 75cc/hr overnight and three boluses yesterday. No longer dizzy when standing.   Lab Results:  Recent Labs  03/01/14 1240 03/03/14 1815  WBC 10.7* 13.5*  HGB 8.8* 8.4*  HCT 27.9* 27.0*  PLT 220 234   BMET  Recent Labs  03/01/14 1240 03/03/14 1815  NA 137 133*  K 4.0 3.9  CL 101 100  CO2 25 22  GLUCOSE 143* 93  BUN 10 14  CREATININE 0.81 0.82  CALCIUM 8.7 8.3*    Studies/Results: No results found.  Assessment/Plan: Improving, monitoring BP   LOS: 4 days  Continue to mobilize as tolerated in LSO with PT.    Kristen Snow, Kristen Snow 03/04/2014, 12:12 PM

## 2014-03-04 NOTE — Evaluation (Signed)
Physical Therapy Evaluation Patient Details Name: Kristen ClarkRita S Snow MRN: [id number] DOB: 04/13/1950 Today's Date: 03/04/2014   History of Present Illness  63 y.o. adm 02/28/14 for Redo L5-S1 Anterior lumbar interbody fusion with partial L5 corpectomy (due to fracture) followed by revision posterior fusion with pedicle screws L 4, L 5 bilaterally with S 1 left pedicle screw PMHx-back surgery, Lt THA, fibromyalgia, DM, HTN, anxiety, depression, afib  Clinical Impression  Patient is s/p above surgery resulting in the deficits listed below (see PT Problem List). Pt with limited ambulation PTA (only room to room) due to bil leg weakness with knees buckling and near falls). Patient will benefit from skilled PT to increase their independence and safety with mobility (while adhering to their precautions) to allow discharge home with family's assist. Lengthy talk about pt's original plan to go to her daughter's home with 8 steps to enter. Encouraged pt to have her daughter stay with her at her home with 2 small steps to enter.       Follow Up Recommendations Home health PT;Supervision/Assistance - 24 hour (if pt able to d/c to her home with only 2 small steps; if must go to her daughter's then may need SNF vs CIR prior to d/c home)    Equipment Recommendations  None recommended by PT    Recommendations for Other Services       Precautions / Restrictions Precautions Precautions: Back;Fall Precaution Comments: pt able to state 2/3 precautions; vc to adhere during mobility Required Braces or Orthoses: Spinal Brace Spinal Brace: Lumbar corset;Applied in supine position      Mobility  Bed Mobility Overal bed mobility: Needs Assistance Bed Mobility: Sidelying to Sit Rolling: Supervision Sidelying to sit: Mod assist;HOB elevated       General bed mobility comments: vc for rolling technique to don brace; due to BP issues, slowly elevated HOB to 50 while monitoring BP (stable), then assisted pt to  pivot legs off EOB and come onto side and then sit  Transfers Overall transfer level: Needs assistance Equipment used: Rolling walker (2 wheeled) Transfers: Sit to/from Stand Sit to Stand: Min assist         General transfer comment: stabilizing assist  Ambulation/Gait Ambulation/Gait assistance: Min assist Ambulation Distance (Feet): 4 Feet Assistive device: Rolling walker (2 wheeled) Gait Pattern/deviations: Step-through pattern;Decreased stride length;Trunk flexed   Gait velocity interpretation: Below normal speed for age/gender General Gait Details: distance limited to allow BP monitored (stable); pt denying dizziness; pt ambulates only room to room distances PTA  Stairs Stairs:  (lengthy discussion re: options as anticipate 8 stairs will not be feasible for pt to safely manage on discharge in 3-4 days; w/c with family carrying pt, ambulance with stretcher)          Wheelchair Mobility    Modified Rankin (Stroke Patients Only)       Balance Overall balance assessment: Needs assistance Sitting-balance support: Bilateral upper extremity supported;Feet supported Sitting balance-Leahy Scale: Poor     Standing balance support: Bilateral upper extremity supported Standing balance-Leahy Scale: Poor                               Pertinent Vitals/Pain See vitals flow sheet.  Pain Assessment: 0-10 Pain Score: 3  Pain Location: back Pain Intervention(s): Limited activity within patient's tolerance;Monitored during session;Repositioned    Home Living Family/patient expects to be discharged to:: Private residence Living Arrangements: Children Available Help at Discharge: Family;Available  24 hours/day (daughters splitting time) Type of Home: House Home Access: Stairs to enter Entrance Stairs-Rails: Right;Left;Can reach both (dtr's; pt's none) Entrance Stairs-Number of Steps: 8  (daughter's 8 with rails; pt's 2, no rails) Home Layout: One  level;Multi-level;Able to live on main level with bedroom/bathroom (dtr one level; pt's she can stay on main floor) Home Equipment: Walker - 2 wheels;Walker - 4 wheels;Bedside commode;Shower seat;Adaptive equipment Additional Comments: Pt reports she has not done more than the 2 small steps in/out of her home for many months (it's one step that son put a "big brick" in front of to make 2 small steps    Prior Function Level of Independence: Needs assistance   Gait / Transfers Assistance Needed: using rollator only room to room PTA due to knees buckling if she pushes too far; assist on steps (only leaving home for MD appts)  ADL's / Homemaking Assistance Needed: assist with LB dressing as well as with bathing; assist with cooking        Hand Dominance   Dominant Hand: Right    Extremity/Trunk Assessment   Upper Extremity Assessment: Overall WFL for tasks assessed           Lower Extremity Assessment: Generalized weakness      Cervical / Trunk Assessment: Kyphotic  Communication   Communication: No difficulties  Cognition Arousal/Alertness: Awake/alert Behavior During Therapy: WFL for tasks assessed/performed Overall Cognitive Status: Within Functional Limits for tasks assessed                      General Comments General comments (skin integrity, edema, etc.): Reports near falls with knees buckling; [facility] "they pushed me to walk in the halls too far. I told them my knees would give out."    Exercises        Assessment/Plan    PT Assessment Patient needs continued PT services  PT Diagnosis Difficulty walking;Generalized weakness;Acute pain   PT Problem List Decreased strength;Decreased activity tolerance;Decreased balance;Decreased mobility;Decreased knowledge of use of DME;Decreased knowledge of precautions;Cardiopulmonary status limiting activity;Pain  PT Treatment Interventions DME instruction;Gait training;Stair  training;Functional mobility training;Therapeutic activities;Patient/family education   PT Goals (Current goals can be found in the Care Plan section) Acute Rehab PT Goals Patient Stated Goal: regain strength and independence PT Goal Formulation: With patient Time For Goal Achievement: 03/11/14 Potential to Achieve Goals: Good    Frequency Min 5X/week   Barriers to discharge Inaccessible home environment plans had been to stay with her daughter with 8 steps to enter    Co-evaluation               End of Session Equipment Utilized During Treatment: Gait belt;Back brace Activity Tolerance: Patient tolerated treatment well Patient left: in chair;with call bell/phone within reach;with chair alarm set Nurse Communication: Mobility status (limited ambulation distances PTA; stable BP)         Time: 0820-0906 PT Time Calculation (min) (ACUTE ONLY): 46 min   Charges:   PT Evaluation $Initial PT Evaluation Tier I: 1 Procedure PT Treatments $Gait Training: 8-22 mins $Therapeutic Activity: 23-37 mins   PT G Codes:          Jissel Slavens 03/04/2014, 9:34 AM  Pager [phone]

## 2014-03-04 NOTE — Progress Notes (Signed)
OT Cancellation Note  Patient Details Name: Kristen Snow MRN: [id number] DOB: 03/31/1951   Cancelled Treatment:    Reason Eval/Treat Not Completed: Other (comment), attempted skilled OT, pt. Was just finishing PT and needed time to rest.  Will check back as able.    Robet LeuMorris, Jessah Danser Lorraine, COTA/L 03/04/2014, 11:34 AM

## 2014-03-04 NOTE — Clinical Social Work Note (Addendum)
CSW Consult Acknowledged:   CSW reviewed chart. CSW placed a follow up call to the pt's daughter to confirm PT's recommendation for Home PT. CSW is awaiting a call back.    Addendum:  CSW spoke to Ena Dawleyiana Whittaker [phone]. Diane reported that she will take her mother to her home. Lafonda MossesDiana reported that they have strong support in order to get the pt up the stairs. CSW will sign off at this time.   [place], MSW, LCSWA [phone]

## 2014-03-05 ENCOUNTER — Encounter (HOSPITAL_COMMUNITY): Payer: Self-pay | Admitting: Neurosurgery

## 2014-03-05 LAB — GLUCOSE, CAPILLARY
Glucose-Capillary: 100 mg/dL — ABNORMAL HIGH (ref 70–99)
Glucose-Capillary: 95 mg/dL (ref 70–99)
Glucose-Capillary: 109 mg/dL — ABNORMAL HIGH (ref 70–99)
Glucose-Capillary: 119 mg/dL — ABNORMAL HIGH (ref 70–99)
Glucose-Capillary: 86 mg/dL (ref 70–99)

## 2014-03-05 LAB — POCT I-STAT 7, (LYTES, BLD GAS, ICA,H+H)
Acid-base deficit: 1 mmol/L (ref 0.0–2.0)
BICARBONATE: 24 meq/L (ref 20.0–24.0)
Calcium, Ion: 1.19 mmol/L (ref 1.13–1.30)
HEMATOCRIT: 27 % — AB (ref 36.0–46.0)
HEMOGLOBIN: 9.2 g/dL — AB (ref 12.0–15.0)
O2 Saturation: 100 %
PH ART: 7.409 (ref 7.350–7.450)
Patient temperature: 35.7
Potassium: 3.8 mEq/L (ref 3.7–5.3)
Sodium: 140 mEq/L (ref 137–147)
TCO2: 25 mmol/L (ref 0–100)
pCO2 arterial: 37.5 mmHg (ref 35.0–45.0)
pO2, Arterial: 193 mmHg — ABNORMAL HIGH (ref 80.0–100.0)

## 2014-03-05 LAB — POCT I-STAT GLUCOSE
Glucose, Bld: 129 mg/dL — ABNORMAL HIGH (ref 70–99)
OPERATOR ID: 305741

## 2014-03-05 NOTE — Progress Notes (Signed)
Occupational Therapy Treatment Patient Details Name: Kristen Snow MRN: [id number] DOB: 08/04/1950 Today's Date: 03/05/2014    History of present illness 63 y.o. adm 02/28/14 for Redo L5-S1 Anterior lumbar interbody fusion with partial L5 corpectomy (due to fracture) followed by revision posterior fusion with pedicle screws L 4, L 5 bilaterally with S 1 left pedicle screw PMHx-back surgery, Lt THA, fibromyalgia, DM, HTN, anxiety, depression, afib   OT comments  Pt making gains with ADL tasks. Discussed D/C plans and [name] home with duahgter. Pt will need to have 24/7 S initially after D/C. Minguard with ambulation for ADL. Brace doffed in supine. Daughters will need to be trained in donning doffing lumbar corsett in supine prior to D/C. Continue to recommend HHOT.  Follow Up Recommendations  Home health OT;Supervision/Assistance - 24 hour    Equipment Recommendations  Other (comment) AE   Recommendations for Other Services      Precautions / Restrictions Precautions Precautions: Back;Fall Precaution Comments: pt able to state precuations  Required Braces or Orthoses: Spinal Brace Spinal Brace: Lumbar corset;Applied in supine position       Mobility Bed Mobility Overal bed mobility: Needs Assistance Bed Mobility: Sit to Sidelying Rolling: Supervision Sidelying to sit: Min assist     Sit to sidelying: Min assist Transfers Overall transfer level: Needs assistance Equipment used: Rolling walker (2 wheeled) Transfers: Sit to/from [employer] CorporationStand;Stand Pivot Transfers Sit to Stand: Min guard Stand pivot transfers: Min guard       General transfer comment: vc for back precautions    Balance   Sitting-balance support: No upper extremity supported;Feet supported Sitting balance-Leahy Scale: Good     Standing balance support: During functional activity Standing balance-Leahy Scale: Fair (able to stand and complete hygiene after toileting )                      ADL                       Lower Body Dressing:  (educated pt on use of reacher)   Toilet Transfer: Min guard;Ambulation;BSC (set up for hygiene after toileting with use of tongs)        Pt able to verbalize precautions but requires moc vc to follow during ADL     General ADL Comments: Increasing independecne with AE      Vision                     Perception     Praxis      Cognition   Behavior During Therapy: [facility]FL for tasks assessed/performed Overall Cognitive Status: Within Functional Limits for tasks assessed                       Extremity/Trunk Assessment               Exercises     Shoulder Instructions       General Comments  Pt has been sitting for prolonged period of time today. Educated pt on recommending she limit time up in chair to 1 hour intervals.    Pertinent Vitals/ Pain       Pain Assessment: 0-10 Pain Score: 8  Pain Location: back Pain Descriptors / Indicators: Aching Pain Intervention(s): Limited activity within patient's tolerance;Monitored during session;Repositioned;Patient requesting pain meds-RN notified  Home Living  Prior Functioning/Environment              Frequency Min 2X/week     Progress Toward Goals  OT Goals(current goals can now be found in the care plan section)  Progress towards OT goals: Progressing toward goals  Acute Rehab OT Goals Patient Stated Goal: regain strength and independence OT Goal Formulation: With patient Time For Goal Achievement: 03/09/14 Potential to Achieve Goals: Good ADL Goals Pt Will Perform Lower Body Dressing: with adaptive equipment;with min guard assist;sit to/from stand Pt Will Transfer to Toilet: with min guard assist;ambulating;bedside commode Pt Will Perform Toileting - Clothing Manipulation and hygiene: with min guard assist;sit to/from stand Additional ADL Goal #1: Pt will independently  verbalize and demonstrate 3/3 back precautions. Additional ADL Goal #2: Pt will independently donn/doff back brace.  Plan Discharge plan remains appropriate    Co-evaluation                 End of Session Equipment Utilized During Treatment: Gait belt;Back brace;Rolling walker   Activity Tolerance Patient tolerated treatment well   Patient Left in bed;with call bell/phone within reach   Nurse Communication Mobility status        Time: [id number] OT Time Calculation (min): 29 min  Charges: OT General Charges $OT Visit: 1 Procedure OT Treatments $Self Care/Home Management : 23-37 mins  Yamna Mackel,HILLARY 03/05/2014, 2:06 PM   [facility] Center[facility] Dannika Hilgeman, OTR/L  [phone] 03/05/2014

## 2014-03-05 NOTE — Progress Notes (Signed)
Physical Therapy Treatment Patient Details Name: Kristen ClarkRita S Snow MRN: [id number] DOB: 09/01/1950 Today's Date: 03/05/2014    History of Present Illness 63 y.o. adm 02/28/14 for Redo L5-S1 Anterior lumbar interbody fusion with partial L5 corpectomy (due to fracture) followed by revision posterior fusion with pedicle screws L 4, L 5 bilaterally with S 1 left pedicle screw PMHx-back surgery, Lt THA, fibromyalgia, DM, HTN, anxiety, depression, afib    PT Comments    Pt mobilizing better today (60 ft, which she admits is the farthest she's been able to walk in some time, has been very limited and debilitated x 9 months per pt). Appreciate LCSW's note re: discussion with pt's daughter, and her confidence that the family can get the pt up the 8 steps to enter her daughter's home. Pt very motivated and wants to walk several times per day. Informed RN.    Follow Up Recommendations  Home health PT;Supervision/Assistance - 24 hour     Equipment Recommendations  None recommended by PT    Recommendations for Other Services       Precautions / Restrictions Precautions Precautions: Back;Fall Precaution Comments: pt able to state 3/3 precautions; vc for no twisting with rolling, otherwise adhered  Required Braces or Orthoses: Spinal Brace Spinal Brace: Lumbar corset;Applied in supine position    Mobility  Bed Mobility Overal bed mobility: Needs Assistance Bed Mobility: Sidelying to Sit;Rolling Rolling: Supervision Sidelying to sit: Min assist       General bed mobility comments: vc for rolling technique to don brace (with rails); vc for side to sit to maintain back precautions and assist to raise torso and minimize twisting (with rail)  Transfers Overall transfer level: Needs assistance Equipment used: Rolling walker (2 wheeled) Transfers: Sit to/from [employer]Stand;Stand Pivot Transfers Sit to Stand: Min guard Stand pivot transfers: Min guard       General transfer comment: vc for safe use  of RW/hand placement; pivot to [facility] IncBSC  Ambulation/Gait Ambulation/Gait assistance: Min assist Ambulation Distance (Feet): 60 Feet Assistive device: Rolling walker (2 wheeled) Gait Pattern/deviations: Step-through pattern;Decreased stride length;Trunk flexed   Gait velocity interpretation: Below normal speed for age/gender General Gait Details: vc for upright posture and proximity to RW; pt reports she has been very limited/weak/flexed for 9 months   Stairs            Wheelchair Mobility    Modified Rankin (Stroke Patients Only)       Balance   Sitting-balance support: No upper extremity supported;Feet supported Sitting balance-Leahy Scale: Fair       Standing balance-Leahy Scale: Poor                      Cognition Arousal/Alertness: Awake/alert Behavior During Therapy: WFL for tasks assessed/performed Overall Cognitive Status: Within Functional Limits for tasks assessed                      Exercises      General Comments General comments (skin integrity, edema, etc.): noted LCSW note re: daughter insisting on taking pt to her home with 8 steps to enter with "strong support" to get her into home      Pertinent Vitals/Pain Pain Assessment: 0-10 Pain Score: 6  Pain Location: back Pain Intervention(s): Limited activity within patient's tolerance;Monitored during session;Premedicated before session;Repositioned    Home Living                      Prior Function  PT Goals (current goals can now be found in the care plan section) Acute Rehab PT Goals Patient Stated Goal: regain strength and independence Progress towards PT goals: Progressing toward goals    Frequency  Min 5X/week    PT Plan Current plan remains appropriate    Co-evaluation             End of Session Equipment Utilized During Treatment: Gait belt;Back brace Activity Tolerance: Patient tolerated treatment well Patient left: in chair;with call  bell/phone within reach;with chair alarm set;with nursing/sitter in room     Time: 1002-1025 PT Time Calculation (min) (ACUTE ONLY): 23 min  Charges:  $Gait Training: 8-22 mins $Therapeutic Activity: 8-22 mins                    G Codes:      Kristen Snow 03/05/2014, 10:40 AM Pager [phone]

## 2014-03-05 NOTE — Plan of Care (Signed)
Problem: Phase II Progression Outcomes Goal: Verbalizes of donning/doffing brace Outcome: Progressing Goal: Understands assist devices with ambulation Outcome: Completed/Met Date Met:  03/05/14 Goal: PT/OT consults completed Outcome: Completed/Met Date Met:  03/05/14

## 2014-03-05 NOTE — Progress Notes (Signed)
Subjective: Patient reports "I feel good. I've been up to the chair "  Objective: Vital signs in last 24 hours: Temp:  [98.1 F (36.7 C)-99.3 F (37.4 C)] 98.6 F (37 C) (11/25 0612) Pulse Rate:  [84-107] 84 (11/25 0612) Resp:  [18] 18 (11/25 0612) BP: (106-126)/(40-69) 120/52 mmHg (11/25 0612) SpO2:  [95 %-100 %] 96 % (11/25 0612)  Intake/Output from previous day: 11/24 0701 - 11/25 0700 In: 360 [P.O.:360] Out: 300 [Urine:300] Intake/Output this shift:    Alert, conversant, eating breakfast. Pt without c/o pain/discomfort/ dizziness this morning. Blood pressures much improved. Pt hopes to work more with PT today.  Lab Results:  Recent Labs  03/03/14 1815  WBC 13.5*  HGB 8.4*  HCT 27.0*  PLT 234   BMET  Recent Labs  03/03/14 1815  NA 133*  K 3.9  CL 100  CO2 22  GLUCOSE 93  BUN 14  CREATININE 0.82  CALCIUM 8.3*    Studies/Results: No results found.  Assessment/Plan: Improving   LOS: 5 days  Continue to mobilize in LSO.    Georgiann Cockeroteat, Arwilda [place] 03/05/2014, 8:20 AM

## 2014-03-06 LAB — GLUCOSE, CAPILLARY
Glucose-Capillary: 81 mg/dL (ref 70–99)
Glucose-Capillary: 115 mg/dL — ABNORMAL HIGH (ref 70–99)

## 2014-03-06 MED ORDER — OXYCODONE-ACETAMINOPHEN 7.5-325 MG PO TABS: 2.0000 | ORAL_TABLET | Freq: Four times a day (QID) | ORAL | Status: AC | PRN

## 2014-03-06 NOTE — Progress Notes (Signed)
CARE MANAGEMENT NOTE 03/06/2014  Patient:  Kristen Snow,Kristen Snow   Account Number:  [id number]  Date Initiated:  03/03/2014  Documentation initiated by:  Jiles CrockerHANDLER,BRENDA  Subjective/Objective Assessment:   ADMITTED FOR SURGERY     Action/Plan:   CM FOLLOWING FOR DCP   Anticipated DC Date:  03/05/2014   Anticipated DC Plan:  HOME W HOME HEALTH SERVICES      DC Planning Services  CM consult      [facility]   Choice offered to / List presented to:  C-1 Patient        HH arranged  HH-1 RN  HH-2 PT      [facility]   Status of service:  Completed, signed off Medicare Important Message given?  YES (If response is "NO", the following Medicare IM given date fields will be blank) Date Medicare IM given:  03/03/2014 Medicare IM given by:  Jiles CrockerHANDLER,BRENDA Date Additional Medicare IM given:  03/06/2014 Additional Medicare IM given by:  Isidoro DonningALESIA Omie Ferger  [facility]  Per UR Regulation:  Reviewed for med. necessity/level of care/duration of stay  If discussed at Long Length of Stay Meetings, dates discussed:    Comments:  03/06/2014 1030 NCM spoke to pt and [facility]. [facility] [phone]. Left contact number for weekday NCM for [facility] to follow up if any questions. No request for DME.  Isidoro DonningAlesia Traveon Louro RN CCM Case Mgmt phone [phone]

## 2014-03-06 NOTE — Progress Notes (Signed)
Discharge instructions and medications discussed with patient. She states she understands instructions for both. IV removed. VSS, no complaints of pain, IV removed. Patient is waiting for her son to pick her up to return home.

## 2014-03-06 NOTE — Discharge Summary (Signed)
Physician Discharge Summary  Patient ID: Kristen ClarkRita S Doby MRN: [id number] DOB/AGE: 64/11/1950 64 y.o.  Admit date: 02/28/2014 Discharge date: 03/06/2014  Admission Diagnoses: Failed fusion/ fracture/ hardware failure   Discharge Diagnoses: Same   Discharged Condition: good  Hospital Course: The patient was admitted on 02/28/2014 and taken to the operating room where the patient underwent redo lumbar fusion. The patient tolerated the procedure well and was taken to the recovery room and then to the floor in stable condition. The hospital course was routine. There were no complications. The wound remained clean dry and intact. Pt had appropriate back soreness. No complaints of leg pain or new N/T/W. The patient remained afebrile with stable vital signs, and tolerated a regular diet. The patient continued to increase activities, and pain was well controlled with oral pain medications.   Consults: None  Significant Diagnostic Studies:  Results for orders placed or performed during the hospital encounter of 02/28/14  Glucose, capillary  Result Value Ref Range   Glucose-Capillary 92 70 - 99 mg/dL  Glucose, capillary  Result Value Ref Range   Glucose-Capillary 104 (H) 70 - 99 mg/dL   Comment 1 Notify RN   Glucose, capillary  Result Value Ref Range   Glucose-Capillary 115 (H) 70 - 99 mg/dL  Glucose, capillary  Result Value Ref Range   Glucose-Capillary 113 (H) 70 - 99 mg/dL  CBC  Result Value Ref Range   WBC 10.7 (H) 4.0 - 10.5 K/uL   RBC 2.90 (L) 3.87 - 5.11 MIL/uL   Hemoglobin 8.8 (L) 12.0 - 15.0 g/dL   HCT 40.927.9 (L) 81.136.0 - 91.446.0 %   MCV 96.2 78.0 - 100.0 fL   MCH 30.3 26.0 - 34.0 pg   MCHC 31.5 30.0 - 36.0 g/dL   RDW 78.214.8 95.611.5 - 21.315.5 %   Platelets 220 150 - 400 K/uL  Basic metabolic panel  Result Value Ref Range   Sodium 137 137 - 147 mEq/L   Potassium 4.0 3.7 - 5.3 mEq/L   Chloride 101 96 - 112 mEq/L   CO2 25 19 - 32 mEq/L   Glucose, Bld 143 (H) 70 - 99 mg/dL   BUN 10 6 -  23 mg/dL   Creatinine, Ser 0.860.81 0.50 - 1.10 mg/dL   Calcium 8.7 8.4 - 57.810.5 mg/dL   GFR calc non Af Amer 75 (L) >90 mL/min   GFR calc Af Amer 87 (L) >90 mL/min   Anion gap 11 5 - 15  Glucose, capillary  Result Value Ref Range   Glucose-Capillary 173 (H) 70 - 99 mg/dL  Glucose, capillary  Result Value Ref Range   Glucose-Capillary 203 (H) 70 - 99 mg/dL   Comment 1 Notify RN   Glucose, capillary  Result Value Ref Range   Glucose-Capillary 172 (H) 70 - 99 mg/dL  Glucose, capillary  Result Value Ref Range   Glucose-Capillary 179 (H) 70 - 99 mg/dL   Comment 1 Notify RN    Comment 2 Documented in Chart   Glucose, capillary  Result Value Ref Range   Glucose-Capillary 237 (H) 70 - 99 mg/dL  Glucose, capillary  Result Value Ref Range   Glucose-Capillary 121 (H) 70 - 99 mg/dL  Glucose, capillary  Result Value Ref Range   Glucose-Capillary 151 (H) 70 - 99 mg/dL   Comment 1 Notify RN   Glucose, capillary  Result Value Ref Range   Glucose-Capillary 139 (H) 70 - 99 mg/dL   Comment 1 Notify RN   Glucose, capillary  Result Value Ref Range   Glucose-Capillary 183 (H) 70 - 99 mg/dL  Glucose, capillary  Result Value Ref Range   Glucose-Capillary 245 (H) 70 - 99 mg/dL  Glucose, capillary  Result Value Ref Range   Glucose-Capillary 205 (H) 70 - 99 mg/dL  Glucose, capillary  Result Value Ref Range   Glucose-Capillary 151 (H) 70 - 99 mg/dL   Comment 1 Documented in Chart    Comment 2 Notify RN   CBC with Differential  Result Value Ref Range   WBC 13.5 (H) 4.0 - 10.5 K/uL   RBC 2.80 (L) 3.87 - 5.11 MIL/uL   Hemoglobin 8.4 (L) 12.0 - 15.0 g/dL   HCT 16.127.0 (L) 09.636.0 - 04.546.0 %   MCV 96.4 78.0 - 100.0 fL   MCH 30.0 26.0 - 34.0 pg   MCHC 31.1 30.0 - 36.0 g/dL   RDW 40.914.0 81.111.5 - 91.415.5 %   Platelets 234 150 - 400 K/uL   Neutrophils Relative % 68 43 - 77 %   Neutro Abs 9.3 (H) 1.7 - 7.7 K/uL   Lymphocytes Relative 15 12 - 46 %   Lymphs Abs 2.0 0.7 - 4.0 K/uL   Monocytes Relative 13 (H) 3 -  12 %   Monocytes Absolute 1.7 (H) 0.1 - 1.0 K/uL   Eosinophils Relative 3 0 - 5 %   Eosinophils Absolute 0.4 0.0 - 0.7 K/uL   Basophils Relative 1 0 - 1 %   Basophils Absolute 0.1 0.0 - 0.1 K/uL  Basic metabolic panel  Result Value Ref Range   Sodium 133 (L) 137 - 147 mEq/L   Potassium 3.9 3.7 - 5.3 mEq/L   Chloride 100 96 - 112 mEq/L   CO2 22 19 - 32 mEq/L   Glucose, Bld 93 70 - 99 mg/dL   BUN 14 6 - 23 mg/dL   Creatinine, Ser 7.820.82 0.50 - 1.10 mg/dL   Calcium 8.3 (L) 8.4 - 10.5 mg/dL   GFR calc non Af Amer 74 (L) >90 mL/min   GFR calc Af Amer 86 (L) >90 mL/min   Anion gap 11 5 - 15  Glucose, capillary  Result Value Ref Range   Glucose-Capillary 120 (H) 70 - 99 mg/dL   Comment 1 Documented in Chart    Comment 2 Notify RN   Glucose, capillary  Result Value Ref Range   Glucose-Capillary 122 (H) 70 - 99 mg/dL   Comment 1 Documented in Chart    Comment 2 Notify RN   Glucose, capillary  Result Value Ref Range   Glucose-Capillary 101 (H) 70 - 99 mg/dL   Comment 1 Documented in Chart    Comment 2 Notify RN   Glucose, capillary  Result Value Ref Range   Glucose-Capillary 178 (H) 70 - 99 mg/dL   Comment 1 Documented in Chart    Comment 2 Notify RN   Glucose, capillary  Result Value Ref Range   Glucose-Capillary 224 (H) 70 - 99 mg/dL   Comment 1 Documented in Chart    Comment 2 Notify RN   Glucose, capillary  Result Value Ref Range   Glucose-Capillary 247 (H) 70 - 99 mg/dL  Glucose, capillary  Result Value Ref Range   Glucose-Capillary 95 70 - 99 mg/dL   Comment 1 Notify RN    Comment 2 Documented in Chart   Glucose, capillary  Result Value Ref Range   Glucose-Capillary 100 (H) 70 - 99 mg/dL  Glucose, capillary  Result Value Ref Range  Glucose-Capillary 109 (H) 70 - 99 mg/dL   Comment 1 Documented in Chart    Comment 2 Notify RN   Glucose, capillary  Result Value Ref Range   Glucose-Capillary 119 (H) 70 - 99 mg/dL   Comment 1 Notify RN    Comment 2 Documented in  Chart   Glucose, capillary  Result Value Ref Range   Glucose-Capillary 86 70 - 99 mg/dL   Comment 1 Notify RN   Glucose, capillary  Result Value Ref Range   Glucose-Capillary 81 70 - 99 mg/dL  I-STAT 7, (LYTES, BLD GAS, ICA, H+H)  Result Value Ref Range   pH, Arterial 7.409 7.350 - 7.450   pCO2 arterial 37.5 35.0 - 45.0 mmHg   pO2, Arterial 193.0 (H) 80.0 - 100.0 mmHg   Bicarbonate 24.0 20.0 - 24.0 mEq/L   TCO2 25 0 - 100 mmol/L   O2 Saturation 100.0 %   Acid-base deficit 1.0 0.0 - 2.0 mmol/L   Sodium 140 137 - 147 mEq/L   Potassium 3.8 3.7 - 5.3 mEq/L   Calcium, Ion 1.19 1.13 - 1.30 mmol/L   HCT 27.0 (L) 36.0 - 46.0 %   Hemoglobin 9.2 (L) 12.0 - 15.0 g/dL   Patient temperature 16.1 C    Sample type ARTERIAL   I-STAT glucose  Result Value Ref Range   Operator id [id number]    Glucose, Bld 129 (H) 70 - 99 mg/dL    Dg Lumbar Spine 2-3 Views  02/28/2014   CLINICAL DATA:  Anterior lumbar interbody fusion at L5-S1  EXAM: DG C-ARM 61-120 MIN; LUMBAR SPINE - 2-3 VIEW  FLUOROSCOPY TIME:  24 seconds  COMPARISON:  01/14/2014 CT lumbar spine  FINDINGS: Four intraoperative fluoroscopic spot images from lumbar surgery provided. Posterior fusion hardware is noted again. There is a cannulated screw in the S1 vertebral body.  IMPRESSION: Intraoperative fluoroscopic guidance.   Electronically Signed   By: Elige Ko   On: 02/28/2014 12:59   Dg C-arm 1-60 Min-no Report  02/28/2014   CLINICAL DATA: Lumbar fusion with Airo   C-ARM 1-60 MINUTES  Fluoroscopy was utilized by the requesting physician.  No radiographic  interpretation.    Dg C-arm 61-120 Min  02/28/2014   CLINICAL DATA:  Anterior lumbar interbody fusion at L5-S1  EXAM: DG C-ARM 61-120 MIN; LUMBAR SPINE - 2-3 VIEW  FLUOROSCOPY TIME:  24 seconds  COMPARISON:  01/14/2014 CT lumbar spine  FINDINGS: Four intraoperative fluoroscopic spot images from lumbar surgery provided. Posterior fusion hardware is noted again. There is a cannulated screw  in the S1 vertebral body.  IMPRESSION: Intraoperative fluoroscopic guidance.   Electronically Signed   By: Elige Ko   On: 02/28/2014 12:59   Dg Or Local Abdomen  02/28/2014   CLINICAL DATA:  Initial encounter for incorrect instrument count  EXAM: OR LOCAL ABDOMEN  COMPARISON:  12/30/2013.  FINDINGS: 1207 hrs. Patient is status post lumbar fusion, as before. Evidence of interval anterior lumbar interbody fusion at L5-S1. No unexpected radiopaque foreign body.  IMPRESSION: No unexpected radiopaque foreign body.   Electronically Signed   By: Kennith Center M.D.   On: 02/28/2014 12:42    Antibiotics:  Anti-infectives    Start     Dose/Rate Route Frequency Ordered Stop   02/28/14 2200  quiNINE (QUALAQUIN) capsule 648 mg     648 mg Oral 3 times daily 02/28/14 1755     02/28/14 2100  vancomycin (VANCOCIN) IVPB 1000 mg/200 mL premix  1,000 mg200 mL/hr over 60 Minutes Intravenous  Once 02/28/14 1813 02/28/14 2250   02/28/14 0600  ceFAZolin (ANCEF) IVPB 2 g/50 mL premix  Status:  Discontinued     2 g100 mL/hr over 30 Minutes Intravenous On call to O.R. 02/27/14 1414 02/28/14 1726      Discharge Exam: Blood pressure 127/45, pulse 86, temperature 97.7 F (36.5 C), temperature source Oral, resp. rate 20, height 5\' 1"  (1.549 m), weight 173 lb 8 oz (78.699 kg), SpO2 97 %. Neurologic: Grossly normal   Discharge Medications:     Medication List    TAKE these medications        amitriptyline 75 MG tablet  Commonly known as:  ELAVIL  Take 75 mg by mouth at bedtime.     amLODipine 2.5 MG tablet  Commonly known as:  NORVASC  Take 2.5 mg by mouth daily.     ARIPiprazole 10 MG tablet  Commonly known as:  ABILIFY  Take 10 mg by mouth daily.     BENGAY EX  Apply 1 application topically daily as needed (pain).     BIOTIN 5000 5 MG Caps  Generic drug:  Biotin  Take 5 mg by mouth daily.     budesonide-formoterol 160-4.5 MCG/ACT inhaler  Commonly known as:  SYMBICORT  Inhale 2 puffs into  the lungs 2 (two) times daily.     cyanocobalamin 1000 MCG/ML injection  Commonly known as:  (VITAMIN B-12)  Inject 1,000 mcg into the muscle every 14 (fourteen) days. Vitamin B12 - on the 1st and the 15th of each month     esomeprazole 40 MG capsule  Commonly known as:  NEXIUM  Take 40 mg by mouth 2 (two) times daily before a meal.     fenofibrate 145 MG tablet  Commonly known as:  TRICOR  Take 145 mg by mouth daily with supper.     fluticasone 50 MCG/ACT nasal spray  Commonly known as:  FLONASE  Place 2 sprays into both nostrils daily.     gabapentin 300 MG capsule  Commonly known as:  NEURONTIN  Take 900 mg by mouth 3 (three) times daily.     GENTEAL 0.25-0.3 % Gel  Generic drug:  Carboxymethylcell-Hypromellose  Place 1 drop into both eyes 2 (two) times daily as needed (redness/ swelling).     ibandronate 150 MG tablet  Commonly known as:  BONIVA  Take 150 mg by mouth every 30 (thirty) days. Take in the morning with a full glass of water, on an empty stomach, and do not take anything else by mouth or lie down for the next 30 min. (on the 1st of each month)     levocetirizine 5 MG tablet  Commonly known as:  XYZAL  Take 5 mg by mouth daily with supper.     loperamide 2 MG tablet  Commonly known as:  IMODIUM A-D  Take 2 mg by mouth daily.     LORazepam 1 MG tablet  Commonly known as:  ATIVAN  Take 1 mg by mouth 3 (three) times daily as needed for anxiety.     memantine 10 MG tablet  Commonly known as:  NAMENDA  Take 10 mg by mouth 2 (two) times daily.     metoprolol tartrate 25 MG tablet  Commonly known as:  LOPRESSOR  Take 25 mg by mouth 2 (two) times daily.     oxyCODONE-acetaminophen 7.5-325 MG per tablet  Commonly known as:  PERCOCET  Take 2 tablets by mouth  every 6 (six) hours as needed for pain.     polyethylene glycol packet  Commonly known as:  MIRALAX / GLYCOLAX  Take 17 g by mouth daily as needed (for constipation).     promethazine 25 MG tablet   Commonly known as:  PHENERGAN  Take 25 mg by mouth 3 (three) times daily as needed for nausea or vomiting.     quiNINE 324 MG capsule  Commonly known as:  QUALAQUIN  Take 648 mg by mouth 3 (three) times daily.     SKELAXIN 800 MG tablet  Generic drug:  metaxalone  Take 800 mg by mouth 3 (three) times daily.     tiotropium 18 MCG inhalation capsule  Commonly known as:  SPIRIVA  Place 18 mcg into inhaler and inhale daily.     Vitamin D (Ergocalciferol) 50000 UNITS Caps capsule  Commonly known as:  DRISDOL  Take 50,000 Units by mouth 2 (two) times a week. On Wednesday and Saturday        Disposition: Home with home health PT   Final Dx: Lumbar fusion      Discharge Instructions    Call MD for:  difficulty breathing, headache or visual disturbances    Complete by:  As directed      Call MD for:  persistant nausea and vomiting    Complete by:  As directed      Call MD for:  redness, tenderness, or signs of infection (pain, swelling, redness, odor or green/yellow discharge around incision site)    Complete by:  As directed      Call MD for:  severe uncontrolled pain    Complete by:  As directed      Call MD for:  temperature >100.4    Complete by:  As directed      Diet - low sodium heart healthy    Complete by:  As directed      Discharge instructions    Complete by:  As directed   No strenuous activity, no bending or twisting, no heavy lifting, no driving, may shower     Increase activity slowly    Complete by:  As directed            Follow-up Information    Follow up with STERN,JOSEPH D, MD. Schedule an appointment as soon as possible for a visit in 2 weeks.   Specialty:  Neurosurgery   Contact information:   [address] [place] [address] [place] [address] [phone]        Signed: Tia Alert 03/06/2014, 9:53 AM

## 2014-03-06 NOTE — Discharge Instructions (Signed)
Spinal Fusion °Care After °Refer to this sheet in the next few weeks. These instructions provide you with information on caring for yourself after your procedure. Your caregiver may also give you more specific instructions. Your treatment has been planned according to current medical practices, but problems sometimes occur. Call your caregiver if you have any problems or questions after your procedure. °HOME CARE INSTRUCTIONS  °· Take whatever pain medicine has been prescribed by your caregiver. Do not take over-the-counter pain medicine unless directed otherwise by your caregiver. °· Do not drive if you are taking narcotic pain medicines. °· Change your bandage (dressing) if necessary or as directed by your caregiver. °· Do not get your surgical cut (incision) wet. After a few days you may take quick showers (rather than baths), but keep your incision clean and dry. Covering the incision with plastic wrap while you shower should keep your incision dry. A few weeks after surgery, once your incision has healed and your caregiver says it is okay, you can take baths or go swimming. °· If you have been prescribed medicine to prevent your blood from clotting, follow the directions carefully. °· Check the area around your incision often. Look for redness and swelling. Also, look for anything leaking from your wound. You can use a mirror or have a family member inspect your incision if it is in a place where it is difficult for you to see. °· Ask your caregiver what activities you should avoid and for how long. °· Walk as much as possible. °· Do not lift anything heavier than 10 pounds (4.5 kilograms) until your caregiver says it is safe. °· Do not twist or bend for a few weeks. Try not to pull on things. Avoid sitting for long periods of time. Change positions at least every hour. °· Ask your caregiver what kinds of exercise you should do to make your back stronger and when you should begin doing these exercises. °SEEK  IMMEDIATE MEDICAL CARE IF:  °· Pain suddenly becomes much worse. °· The incision area is red, swollen, bleeding, or leaking fluid. °· Your legs or feet become increasingly painful, numb, weak, or swollen. °· You have trouble controlling urination or bowel movements. °· You have trouble breathing. °· You have chest pain. °· You have a fever. °MAKE SURE YOU: °· Understand these instructions. °· Will watch your condition. °· Will get help right away if you are not doing well or get worse. °Document Released: 10/15/2004 Document Revised: 06/20/2011 Document Reviewed: 06/10/2010 °[employer]® Patient Information ©2015 [employer]. This information is not intended to replace advice given to you by your health care provider. Make sure you discuss any questions you have with your health care provider. °Spinal Fusion °Spinal fusion is a procedure to make 2 or more of the bones in your spinal column (vertebrae) grow together (fuse). This procedure stops movement between the vertebrae and can relieve pain and prevent deformity.  °Spinal fusion is used to treat the following conditions: °· Fractures of the spine. °· Herniated disk (the spongy material [cartilage] between the vertebrae). °· Abnormal curvatures of the spine, such as scoliosis or kyphosis. °· A weak or an unstable spine, caused by infections or tumor. °RISKS AND COMPLICATIONS °Complications associated with spinal fusion are rare, but they can occur. Possible complications include: °· Bleeding. °· Infection near the incision. °· Nerve damage. Signs of nerve damage are back pain, pain in one or both legs, weakness, or numbness. °· Spinal fluid leakage. °· Blood clot   in your leg, which can move to your lungs. °· Difficulty controlling urination or bowel movements. °BEFORE THE PROCEDURE °· A medical evaluation will be done. This will include a physical exam, blood tests, and imaging exams. °· You will talk with an anesthesiologist. This is the person who will be in  charge of the anesthesia during the procedure. Spinal fusion usually requires that you are asleep during the procedure (general anesthesia). °· You will need to stop taking certain medicines, particularly those associated with an increased risk of bleeding. Ask your caregiver about changing or stopping your regular medicines. °· If you smoke, you will need to stop at least 2 weeks before the procedure. Smoking can slow down the healing process, especially fusion of the vertebrae, and increase the risk of complications. °· Do not eat or drink anything for at least 8 hours before the procedure. °PROCEDURE  °A cut (incision) is made over the vertebrae that will be fused. The back muscles are separated from the vertebrae. If you are having this procedure to treat a herniated disk, the disc material pressing on the nerve root is removed (decompression). The area where the disk is removed is then filled with extra bone. Bone from another part of your body (autogenous bone) or bone from a bone donor (allograft bone) may be used. The extra bone promotes fusion between the vertebrae. Sometimes, specific medicines are added to the fusion area to promote bone healing. In most cases, screws and rods or metal plates will be used to attach the vertebrae to stabilize them while they fuse.  °AFTER THE PROCEDURE  °· You will stay in a recovery area until the anesthesia has worn off. Your blood pressure and pulse will be checked frequently. °· You will be given antibiotics to prevent infection. °· You may continue to receive fluids through an intravenous (IV) tube while you are still in the hospital. °· Pain after surgery is normal. You will be given pain medicine. °· You will be taught how to move correctly and how to stand and walk. While in bed, you will be instructed to turn frequently, using a "log rolling" technique, in which the entire body is moved without twisting the back. °Document Released: 12/25/2002 Document Revised:  06/20/2011 Document Reviewed: 06/10/2010 °[employer]® Patient Information ©2015 [employer]. This information is not intended to replace advice given to you by your health care provider. Make sure you discuss any questions you have with your health care provider. ° °

## 2014-03-13 ENCOUNTER — Encounter (HOSPITAL_COMMUNITY): Payer: Self-pay | Admitting: Neurosurgery

## 2014-04-24 ENCOUNTER — Encounter (HOSPITAL_COMMUNITY): Payer: Self-pay | Admitting: Neurosurgery

## 2016-03-26 IMAGING — CT CT L SPINE W/ CM
1 of 11 series · 1 of 33 positions shown, 2 images · non-contrast
Comparison: none

CLINICAL DATA: 62-year-old female with multiple prior back
surgeries now with increased pain radiating to the left lower
extremity nearly to the ankle. Left lower extremity cramping.
Difficulty sitting and standing for long periods. Fall in [REDACTED].
Initial encounter.
TECHNIQUE: Contiguous axial images were obtained through the Cervical,
Thoracic, and Lumbar spine after the intrathecal infusion of
infusion. Coronal and sagittal reconstructions were obtained of the
axial image sets.

[Series 2011: angled disc · axial · 0.34mm/px · z∈[-504,-504]mm · 1 of 26 slices shown, 2 images]
[im 1/26  soft-tissue]
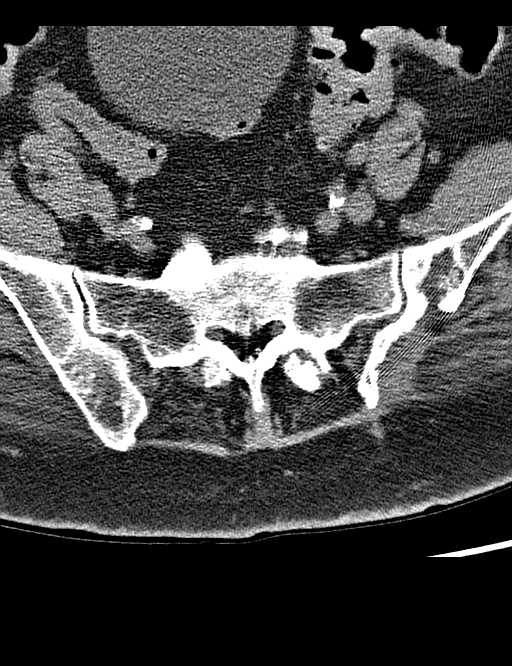
[im 1/26  bone]
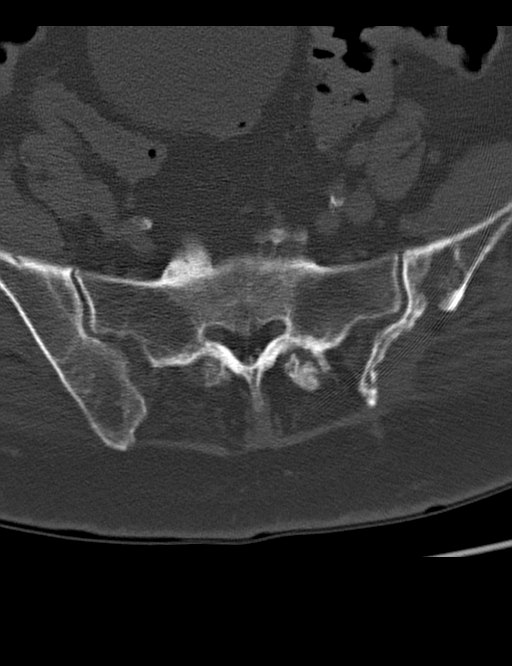

[1 of 33 positions shown; findings below may reference images not displayed]

FLUOROSCOPY TIME:  1 min 30 seconds

PROCEDURE:
LUMBAR AND THORACIC MYELOGRAM

CT LUMBAR MYELOGRAM

CT THORACIC MYELOGRAM

Lumbar puncture and intrathecal contrast administration were
performed by Dr. PRIMOZZ CAPUDER who will separately report for the
portion of the procedure. I personally supervised acquisition of the
myelogram images.

I personally performed the lumbar puncture and administered the
intrathecal contrast. I also personally supervised acquisition of
the myelogram images.
FINDINGS: THORACIC AND LUMBAR MYELOGRAM FINDINGS:

Stable appearance of posterior spinal hardware from the T10 to the
L5 level. No pedicle screws at L1 or L3 as before. L1 compression
fracture appears stable. Extensive aortoiliac calcified
atherosclerosis. Interbody implants at L2 L3 through L4 L followup.

Irregularity in the appearance of the thecal sac at T12-L1, with a
folded appearance (series 2). This may affect the lateral recesses,
attention on CT described below.

No obstruction to the cephalad flow of contrast into the thoracic
spine using gravity. The thecal sac at the operated lower thoracic
levels appears normally patent. Thecal sac delineation was difficult
above that level by plain myelography.

Cross-table lateral view of the lumbar spine was obtained during
needle placement.

CT THORACIC MYELOGRAM FINDINGS:

Visualized lung parenchyma remarkable for multifocal areas of
ground-glass opacity favored to reflect a combination of atelectasis
and scarring. There may be underlying paraseptal emphysema. No
pericardial or pleural effusion identified. Coronary artery
calcified plaque and/or a vascular stent is evident. Negative
thoracic inlet. Negative visualized non contrast upper abdominal
viscera. Calcified atherosclerosis of the aorta. Negative posterior
paraspinal soft tissues.

Lower thoracic spine hardware described below. Good intrathecal
contrast. Normal myelographic appearance of the thoracic spinal
cord. Conus medullaris mostly visible at the T12 level.

C7-T1: Partially visible, negative.

T1-T2: Negative except for mild uncovertebral hypertrophy.

T2-T3: Partially calcified central disc protrusion, disc osteophyte
complex (sagittal image 39) narrowing the ventral CSF space. No
significant spinal stenosis. No foraminal involvement.

T3-T4: Negative.

T4-T5: Negative disc. Moderate facet hypertrophy greater on the
right. Moderate right T4 foraminal stenosis (sagittal image 34).

T5-T6: Central calcified disc protrusion or disc osteophyte complex
narrowing the ventral CSF space. No significant spinal stenosis.
Moderate to severe facet hypertrophy on the right, severe right T5
foraminal stenosis (sagittal image 34).

T6-T7: Circumferential disc bulge. Moderate facet hypertrophy.
Moderate bilateral T6 foraminal stenosis.

T7-T8: Disc space loss. Trace vacuum disc. Lobulated posterior disc
protrusion (sagittal image 41), effacing the ventral CSF space. The
dorsal CSF space is patent. Disc osteophyte complex eccentric to the
left in does appear to involve the left T7 neural foramen with mild
to moderate stenosis.

T8-T9: Mild partially calcified disc bulge. No significant stenosis.

T9-T10: Mild partially calcified disc bulge. Mild facet hypertrophy.
Mild right T9 foraminal stenosis.

T10-T11: Transpedicular hardware in place with no evidence of
loosening. Broad-based left paracentral disc protrusion best seen on
sagittal image 42 and series 202, image 108. This narrows the
ventral CSF space without significant spinal stenosis. Posterior
element hypertrophy. Associated uncovertebral hypertrophy on the
left with moderate left T10 foraminal stenosis suspected (sagittal
image 46). There is suggestion of right posterior element ankylosis.

T11-T12: Small partially calcified central disc protrusion. No
stenosis. No definite arthrodesis.

CT LUMBAR MYELOGRAM FINDINGS:

Aortoiliac calcified atherosclerosis noted. Diverticulosis of the
distal colon. Partially visible distended bladder. Staple line at
the duodenum bulb partially visible. Postoperative changes to the
lumbar posterior soft tissues.

Additional postoperative details are below, but there are chronic
appearing fractures of the L1 and L5 vertebral bodies. Widespread
lumbar decompression. Osteopenia in the lumbar spine. Visible sacral
ala and SI joints intact. Postoperative changes to the medial left
iliac bone.

Good intrathecal contrast opacification. Normal myelographic
appearance of the lower thoracic spinal cord, with tip of the conus
at T12-L1. However, there is clumping of nerve roots intermittently
noted (right thecal sac at L1-L2, central and posterior thecal sac
from L2-L3 to L4-L5.

T12-L1: Circumferential disc osteophyte complex. Moderate posterior
element hypertrophy. No significant spinal stenosis. Moderate left
T12 and mild right T12 foraminal stenosis primarily due to bony
hypertrophy.

L1-L2: Chronic L1 compression fracture. Near complete loss of the
L1-L2 disc space. A bulky circumferential disc osteophyte complex
but sequelae of decompression questionable posterior element
arthrodesis on the left. No spinal stenosis. Moderate to severe
bilateral L1 foraminal stenosis.

L2-L3: interbody implant. Transpedicular hardware only at L2,
without evidence of loosening. Evidence of transpedicular screw
removal at L3. Evidence of solid interbody arthrodesis (coronal
image 31). Mild osseous left L2 foraminal stenosis.

L3-L4: Interbody implant. Transpedicular hardware at L4 without
evidence of loosening. Sequelae of decompression. Solid-appearing
interbody arthrodesis (sagittal image 41). No spinal stenosis. Mild
bilateral osseous L3 foraminal stenosis.

L4-L5: Transpedicular hardware without loosening. Interbody implant.
Evidence of solid arthrodesis (sagittal image 39). Sequelae of
decompression. No significant stenosis.

L5-S1: Chronic L5 compression fracture. L5 transpedicular screw tips
are along an anterior plane of the chronic fracture. Circumferential
disc osteophyte complex. Moderate to severe facet hypertrophy.
Subsequent severe spinal stenosis (series 204, image 107). Moderate
to severe bilateral L5 foraminal stenosis.

Sacral epidural lipomatosis.  No S1 foraminal stenosis.
IMPRESSION: 1. Lower thoracic postoperative changes from T10-T12. Suspect right
posterior element ankylosis at T10-T11. No spinal stenosis at these
levels. Moderate multifactorial left T10 foraminal stenosis.
2. Thoracic disc protrusions, most pronounced at T7-T8. No
significant spinal stenosis results. There is additional
multifactorial moderate thoracic neural foraminal stenosis at the
right C4, right T5 (severe), bilateral T6, and left T7 nerve levels.
3. Chronic L1 and L5 compression fractures. Sequelae of posterior
and interbody fusion from L2 to the L5 level. Sequelae of
decompression from the L1 to the L4 level. Solid interbody
arthrodesis L2-L3 through L4-L5.
4. Multifactorial severe spinal and biforaminal stenosis at L5-S1.
Multifactorial moderate to severe biforaminal stenosis at L1-L2.
5. Sequelae of arachnoiditis L1-L2 through L4-L5.
